# Patient Record
Sex: Female | Born: 1990 | Race: White | Hispanic: No | Marital: Married | Smoking: Former smoker
Health system: Southern US, Community
[De-identification: ages and names within clinical notes are randomized; demographics above are authoritative.]

## PROBLEM LIST (undated history)

## (undated) DIAGNOSIS — F32A Depression, unspecified: Secondary | ICD-10-CM

## (undated) DIAGNOSIS — F329 Major depressive disorder, single episode, unspecified: Secondary | ICD-10-CM

## (undated) DIAGNOSIS — F909 Attention-deficit hyperactivity disorder, unspecified type: Secondary | ICD-10-CM

## (undated) DIAGNOSIS — E282 Polycystic ovarian syndrome: Secondary | ICD-10-CM

## (undated) DIAGNOSIS — T7840XA Allergy, unspecified, initial encounter: Secondary | ICD-10-CM

## (undated) DIAGNOSIS — F419 Anxiety disorder, unspecified: Secondary | ICD-10-CM

## (undated) DIAGNOSIS — R011 Cardiac murmur, unspecified: Secondary | ICD-10-CM

## (undated) DIAGNOSIS — N390 Urinary tract infection, site not specified: Secondary | ICD-10-CM

## (undated) HISTORY — DX: Allergy, unspecified, initial encounter: T78.40XA

## (undated) HISTORY — DX: Anxiety disorder, unspecified: F41.9

## (undated) HISTORY — DX: Major depressive disorder, single episode, unspecified: F32.9

## (undated) HISTORY — DX: Urinary tract infection, site not specified: N39.0

## (undated) HISTORY — DX: Depression, unspecified: F32.A

## (undated) HISTORY — DX: Cardiac murmur, unspecified: R01.1

## (undated) HISTORY — DX: Polycystic ovarian syndrome: E28.2

## (undated) HISTORY — PX: NO PAST SURGERIES: SHX2092

---
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
## 2010-05-30 LAB — CBC
HCT: 38.2 % (ref 36.0–46.0)
Hemoglobin: 12.9 g/dL (ref 12.0–15.0)
MCHC: 34.8 g/dL (ref 30.0–36.0)
MCHC: 34.8 g/dL (ref 30.0–36.0)
MCV: 92.1 fL (ref 78.0–100.0)
MCV: 92.4 fL (ref 78.0–100.0)
Platelets: 150 10*3/uL (ref 150–400)
RBC: 3.55 MIL/uL — ABNORMAL LOW (ref 3.87–5.11)
RDW: 12.1 % (ref 11.5–15.5)
WBC: 10.3 10*3/uL (ref 4.0–10.5)

## 2010-05-30 LAB — COMPREHENSIVE METABOLIC PANEL
ALT: 11 U/L (ref 0–35)
Albumin: 3.4 g/dL — ABNORMAL LOW (ref 3.5–5.2)
Alkaline Phosphatase: 50 U/L (ref 39–117)
BUN: 11 mg/dL (ref 6–23)
CO2: 24 mEq/L (ref 19–32)
Calcium: 8.9 mg/dL (ref 8.4–10.5)
Chloride: 104 mEq/L (ref 96–112)
Creatinine, Ser: 1.34 mg/dL — ABNORMAL HIGH (ref 0.4–1.2)
GFR calc non Af Amer: 52 mL/min — ABNORMAL LOW (ref >60)
Potassium: 4 mEq/L (ref 3.5–5.1)
Sodium: 139 mEq/L (ref 135–145)
Total Bilirubin: 0.4 mg/dL (ref 0.3–1.2)
Total Protein: 5.9 g/dL — ABNORMAL LOW (ref 6.0–8.3)

## 2010-05-30 LAB — URINALYSIS, ROUTINE W REFLEX MICROSCOPIC
Bilirubin Urine: NEGATIVE
Nitrite: NEGATIVE
Specific Gravity, Urine: 1.01 (ref 1.005–1.030)
Urobilinogen, UA: 0.2 mg/dL (ref 0.0–1.0)

## 2010-05-30 LAB — URINE CULTURE

## 2010-05-30 LAB — DIFFERENTIAL
Basophils Absolute: 0.1 10*3/uL (ref 0.0–0.1)
Basophils Relative: 1 % (ref 0–1)
Eosinophils Absolute: 0.1 10*3/uL (ref 0.0–0.7)
Eosinophils Absolute: 0.2 10*3/uL (ref 0.0–0.7)
Eosinophils Relative: 1 % (ref 0–5)
Lymphocytes Relative: 27 % (ref 12–46)
Lymphs Abs: 2.1 10*3/uL (ref 0.7–4.0)
Monocytes Absolute: 0.7 10*3/uL (ref 0.1–1.0)
Monocytes Absolute: 0.9 10*3/uL (ref 0.1–1.0)
Monocytes Relative: 9 % (ref 3–12)
Neutro Abs: 6.3 10*3/uL (ref 1.7–7.7)
Neutrophils Relative %: 62 % (ref 43–77)

## 2010-05-30 LAB — POCT PREGNANCY, URINE

## 2010-05-30 LAB — GC/CHLAMYDIA PROBE AMP, GENITAL

## 2010-05-30 LAB — POCT I-STAT, CHEM 8
BUN: 12 mg/dL (ref 6–23)
Creatinine, Ser: 1.5 mg/dL — ABNORMAL HIGH (ref 0.4–1.2)
Glucose, Bld: 85 mg/dL (ref 70–99)
Sodium: 141 mEq/L (ref 135–145)
TCO2: 29 mmol/L (ref 0–100)

## 2010-05-30 LAB — WET PREP, GENITAL
Trich, Wet Prep: NONE SEEN
Yeast Wet Prep HPF POC: NONE SEEN

## 2010-07-15 NOTE — Procedures (Signed)
CLINICAL HISTORY:  A 20 year old girl with episode of tensing up and  generalized shaking. EEG is performed for evaluation of possible seizure.   DESCRIPTION:  The dominant rhythm of this tracing is a moderate amplitude  alpha rhythm of 9-10 Hz which predominates posteriorly, appears without  abnormal symmetry, and attenuates with eye-opening and closing. Low  amplitude fast activity is seen frontally and centrally and appears without  abnormal asymmetry. No focal slowing is noted and no epileptiform discharges  are seen. Drowsiness is not seen in this recording. Photic stimulation  produced symmetric driving responses, really only well seen at 11 and 13 Hz.  Hyperventilation produced mild slowing and buildup of the background rhythms  without appearance of focal abnormality. Single channel devoted to EKG  revealed sinus rhythm throughout with a rate of approximately 90 beats per  minutes.   CONCLUSIONS:  Normal study in the awake state.      EAV:WUJW  D:  06/16/2004 15:28:42  T:  06/16/2004 16:38:43  Job #:  [id number]

## 2011-04-24 ENCOUNTER — Ambulatory Visit (INDEPENDENT_AMBULATORY_CARE_PROVIDER_SITE_OTHER): Payer: BC Managed Care – PPO | Admitting: Family Medicine

## 2011-04-24 VITALS — BP 106/72 | HR 108 | Temp 98.4°F | Resp 20 | Ht 64.0 in | Wt 175.0 lb

## 2011-04-24 DIAGNOSIS — M940 Chondrocostal junction syndrome [Tietze]: Secondary | ICD-10-CM

## 2011-04-24 DIAGNOSIS — R112 Nausea with vomiting, unspecified: Secondary | ICD-10-CM

## 2011-04-24 MED ORDER — IBUPROFEN 800 MG PO TABS: 800.0000 mg | ORAL_TABLET | Freq: Three times a day (TID) | ORAL | Status: AC | PRN

## 2011-04-24 MED ORDER — PROMETHAZINE HCL 12.5 MG PO TABS: 12.5000 mg | ORAL_TABLET | Freq: Four times a day (QID) | ORAL | Status: AC | PRN

## 2011-04-24 NOTE — Progress Notes (Signed)
[facility]:  Office Visit  Chief Complaint:  Chief Complaint  Patient presents with  . Emesis    may have had alcohol poisoning yesterday (pt cannot recall last menses- stated maybe 2 months ago)  . Chest Pain    today  . Shortness of Breath    today    HPI: Chelsea Jimenez is a 21 y.o. female who complains of: 1. Nausea-with some vomiting, she had 4-5 drinks last night over a 5-6 hr period.  2. Chest pressure in mid chest- burning pressure, moves from sternum to both side, increase with palpation and movement. Associated with moderate SOB. She was unable to put a bra on because of pain .  Past Medical History  Diagnosis Date  . PCOS (polycystic ovarian syndrome) dx 2010   History reviewed. No pertinent past surgical history. History   Social History  . Marital Status: Single    Spouse Name: N/A    Number of Children: N/A  . Years of Education: N/A   Social History Main Topics  . Smoking status: Former Smoker -- 0.2 packs/day for 2 years    Types: Cigarettes    Quit date: 08/22/2010  . Smokeless tobacco: None  . Alcohol Use: Yes  . Drug Use: No  . Sexually Active: None   Other Topics Concern  . None   Social History Narrative  . None   Family History  Problem Relation Age of Onset  . Hypertension Mother   . Diabetes Mother   . Heart disease Maternal Grandmother   . Heart disease Maternal Grandfather   . Stroke Maternal Grandfather   . Cancer Paternal Grandmother    Allergies  Allergen Reactions  . Sulfa Antibiotics Rash and Other (See Comments)    child   Prior to Admission medications   Medication Sig Start Date End Date Taking? Authorizing Provider  sertraline (ZOLOFT) 50 MG tablet Take 100 mg by mouth daily.   Yes Historical Provider, MD  traZODone (DESYREL) 50 MG tablet Take 50 mg by mouth at bedtime.   Yes Historical Provider, MD     ROS: The patient denies fevers, chills, night sweats, unintentional weight loss,  palpitations, wheezing, dyspnea on exertion, nausea, vomiting, abdominal pain, dysuria, hematuria, melena, numbness, weakness, or tingling. + CP, SOB  All other systems have been reviewed and were otherwise negative with the exception of those mentioned in the HPI and as above.    PHYSICAL EXAM: Filed Vitals:   04/24/11 1327  BP: 106/72  Pulse: 108  Temp: 98.4 F (36.9 C)  Resp: 20   Filed Vitals:   04/24/11 1327  Height: 5\' 4"  (1.626 m)  Weight: 175 lb (79.379 kg)   Body mass index is 30.04 kg/(m^2).  General: Alert, no acute distress HEENT:  Normocephalic, atraumatic, oropharynx patent. + Dry oral mucosa Cardiovascular:  Regular rate and rhythm, no rubs murmurs or gallops.  No Carotid bruits, radial pulse intact. No pedal edema.  + tenderness on palpation Respiratory: Clear to auscultation bilaterally.  No wheezes, rales, or rhonchi.  No cyanosis, no use of accessory musculature GI: No organomegaly, abdomen is soft and non-tender, positive bowel sounds.  No masses. Skin: No rashes. Neurologic: Facial musculature symmetric. Psychiatric: Patient is appropriate throughout our interaction. Lymphatic: No cervical lymphadenopathy Musculoskeletal: Gait intact.   ASSESSMENT/PLAN: Encounter Diagnoses  Name Primary?  . Costochondritis Yes  . Nausea & vomiting    1. Chest pain secondary to costochondritis secondary to having vomited perfusely after  ingesting alcohol. Rx Ibuprofen with precautions 2. Patient is slightly nauseated, she is dehydrated but wants to go home and take meds  and push fluids. Rx limited # Phenargen  F/u prn if CP continues for xray     Geniva Lohnes PHUONG, DO 04/24/2011 2:32 PM

## 2011-04-25 ENCOUNTER — Ambulatory Visit: Payer: BC Managed Care – PPO

## 2013-09-08 ENCOUNTER — Ambulatory Visit: Payer: Managed Care, Other (non HMO)

## 2013-09-08 ENCOUNTER — Ambulatory Visit (INDEPENDENT_AMBULATORY_CARE_PROVIDER_SITE_OTHER): Payer: Managed Care, Other (non HMO) | Admitting: Family Medicine

## 2013-09-08 VITALS — BP 116/78 | HR 94 | Temp 98.3°F | Resp 16 | Ht 63.5 in | Wt 176.6 lb

## 2013-09-08 DIAGNOSIS — R143 Flatulence: Secondary | ICD-10-CM

## 2013-09-08 DIAGNOSIS — G4700 Insomnia, unspecified: Secondary | ICD-10-CM

## 2013-09-08 DIAGNOSIS — R142 Eructation: Secondary | ICD-10-CM

## 2013-09-08 DIAGNOSIS — R141 Gas pain: Secondary | ICD-10-CM

## 2013-09-08 DIAGNOSIS — R140 Abdominal distension (gaseous): Secondary | ICD-10-CM

## 2013-09-08 DIAGNOSIS — R110 Nausea: Secondary | ICD-10-CM

## 2013-09-08 LAB — POCT UA - MICROSCOPIC ONLY
BACTERIA, U MICROSCOPIC: NEGATIVE
CASTS, UR, LPF, POC: NEGATIVE
CRYSTALS, UR, HPF, POC: NEGATIVE
MUCUS UA: NEGATIVE
RBC, URINE, MICROSCOPIC: NEGATIVE
Yeast, UA: NEGATIVE

## 2013-09-08 LAB — POCT URINALYSIS DIPSTICK
Bilirubin, UA: NEGATIVE
Glucose, UA: NEGATIVE
KETONES UA: NEGATIVE
Nitrite, UA: NEGATIVE
PROTEIN UA: NEGATIVE
RBC UA: NEGATIVE
SPEC GRAV UA: 1.01
UROBILINOGEN UA: 0.2
pH, UA: 6.5

## 2013-09-08 LAB — POCT URINE PREGNANCY: PREG TEST UR: NEGATIVE

## 2013-09-08 MED ORDER — TRAZODONE HCL 50 MG PO TABS: 50.0000 mg | ORAL_TABLET | Freq: Every day | ORAL | Status: DC

## 2013-09-08 MED ORDER — DICYCLOMINE HCL 20 MG PO TABS: 20.0000 mg | ORAL_TABLET | Freq: Three times a day (TID) | ORAL | Status: DC

## 2013-09-08 MED ORDER — ONDANSETRON HCL 8 MG PO TABS: 8.0000 mg | ORAL_TABLET | Freq: Three times a day (TID) | ORAL | Status: DC | PRN

## 2013-09-08 NOTE — Progress Notes (Signed)
Pt states she has had sxs of entireabdomen pain that causes bloating and hard. Nausea has been associated with her sxs without any vomiting.  Pt also states she has had hemorrhoids associated with her sxs and they often occur 45 minutes after eating.

## 2013-09-08 NOTE — Progress Notes (Addendum)
[facility] [address], [place] [id number] [phone]  Date:  09/08/2013   Name:  Chelsea Jimenez   DOB:  08-07-1990   MRN:  [id number]  PCP:  Chelsea NgMCCLUNG,ANGELA, PA-C    Chief Complaint: Abdominal Pain, Nausea, Hemorrhoids and Medication Refill   History of Present Illness:  Chelsea Jimenez is a 23 y.o. very pleasant female patient who presents with the following:  Here today with illness. For about a month she has noted stomach bloating after eating.  Seems to have bloating every time she eats. This will make her clothes fit too tightly, and she will have a lot of pain and nausea.  Sometimes she may have some hard stools and has noted some hemorrhoids.   She is lactose intolerant. She did cut out dairy, but this did not seem to help.   No vomiting, she may sometimes have some diarrhea.   She has not had this in the past. She is generally a healthy person. LMP was 18 months ago- she is on continuous OCP per her OBG for PCOS.   No vaignal or urinary sx.  She has noted some blood on her stool some of the time but thinks it's just from hemorrhoids; she has noted some hard and painful stools.  She has noted some mucus at times.  No family history of UC orCcrohn's disease  She has tried to eat helthier but otherwise no diet changes.  She has not noted any change in her weight.  She did take 2 home HCG tests- yesterday and today.   Both negative  There are no active problems to display for this patient.   Past Medical History  Diagnosis Date  . PCOS (polycystic ovarian syndrome) dx 2010  . Allergy   . Depression   . Heart murmur     History reviewed. No pertinent past surgical history.  History  Substance Use Topics  . Smoking status: Former Smoker -- 0.25 packs/day for 2 years    Types: Cigarettes    Quit date: 08/22/2010  . Smokeless tobacco: Not on file  . Alcohol Use: Yes    Family History  Problem Relation Age of Onset  . Hypertension  Mother   . Diabetes Mother   . Heart disease Maternal Grandmother   . Heart disease Maternal Grandfather   . Stroke Maternal Grandfather   . Cancer Paternal Grandmother     Allergies  Allergen Reactions  . Shellfish Allergy Other (See Comments)    Skin rash  . Sulfa Antibiotics Rash and Other (See Comments)    child    Medication list has been reviewed and updated.  Current Outpatient Prescriptions on File Prior to Visit  Medication Sig Dispense Refill  . traZODone (DESYREL) 50 MG tablet Take 50 mg by mouth at bedtime.      . sertraline (ZOLOFT) 50 MG tablet Take 100 mg by mouth daily.       No current facility-administered medications on file prior to visit.    Review of Systems:  As per HPI- otherwise negative.   Physical Examination: Filed Vitals:   09/08/13 1235  BP: 116/78  Pulse: 94  Temp: 98.3 F (36.8 C)  Resp: 16   Filed Vitals:   09/08/13 1235  Height: 5' 3.5" (1.613 m)  Weight: 176 lb 9.6 oz (80.105 kg)   Body mass index is 30.79 kg/(m^2). Ideal Body Weight: Weight in (lb) to have BMI = 25: 143.1  GEN: WDWN,  NAD, Non-toxic, A & O x 3, overweight, looks well.  Accompanied today by her boyfriend HEENT: Atraumatic, Normocephalic. Neck supple. No masses, No LAD. Ears and Nose: No external deformity. CV: RRR, No M/G/R. No JVD. No thrill. No extra heart sounds. PULM: CTA B, no wheezes, crackles, rhonchi. No retractions. No resp. distress. No accessory muscle use. ABD: S, ND, +BS. No rebound. No HSM.  She has generalized mild tenderness throughout her abdomen EXTR: No c/c/e NEURO Normal gait.  PSYCH: Normally interactive. Conversant. Not depressed or anxious appearing.  Calm demeanor.   [facility] reading (PRIMARY) by  Dr. Patsy Lager. abd series: non- specific bowel gas pattern  ABDOMEN - 2 VIEW  COMPARISON: None.  FINDINGS: The bowel gas pattern is within the limits of normal. There is no ileus or obstruction. There are no abnormal soft  tissue calcifications. The bony structures are unremarkable.  IMPRESSION: There is no acute intra-abdominal abnormality.  Results for orders placed in visit on 09/08/13  POCT UA - MICROSCOPIC ONLY      Result Value Ref Range   WBC, Ur, HPF, POC 2-4     RBC, urine, microscopic neg     Bacteria, U Microscopic neg     Mucus, UA neg     Epithelial cells, urine per micros 0-6     Crystals, Ur, HPF, POC neg     Casts, Ur, LPF, POC neg     Yeast, UA neg    POCT URINALYSIS DIPSTICK      Result Value Ref Range   Color, UA yellow     Clarity, UA clear     Glucose, UA neg     Bilirubin, UA neg     Ketones, UA neg     Spec Grav, UA 1.010     Blood, UA neg     pH, UA 6.5     Protein, UA neg     Urobilinogen, UA 0.2     Nitrite, UA neg     Leukocytes, UA large (3+)    POCT URINE PREGNANCY      Result Value Ref Range   Preg Test, Ur Negative      Assessment and Plan: Abdominal bloating - Plan: DG Abd 2 Views, dicyclomine (BENTYL) 20 MG tablet, Ambulatory referral to Gastroenterology, POCT UA - Microscopic Only, POCT urinalysis dipstick, POCT urine pregnancy, Urine culture  Nausea alone - Plan: ondansetron (ZOFRAN) 8 MG tablet  Insomnia - Plan: traZODone (DESYREL) 50 MG tablet  Chelsea Jimenez is here today with abdominal sx of a month's duration.  I am therefore less suspicious of dangerous etiology such as appendicitis.  Suspect IBS.  Will have her try bentyl as needed for her abdominal cramping and bloating.  zofran prn for nausea.  Will refer to GI as she could also have IBD and may need a colonoscopy Refilled her trazodone.  Await urine culture She declined blood work today- notes that she "almost always" passes out with blood draws.    See patient instructions for more details.     Signed Abbe Amsterdam, MD  7/15: Received urine culture- negative for infection. Her GI sx are somewhat better with the bentyl.

## 2013-09-08 NOTE — Patient Instructions (Signed)
Continue your trazadone and zoloft.  We are going to refer you to see a GI doctor to further discuss your symptoms. In the meantime try the bentyl before meals and before bed to help with spasms and bloating.  You can use zofran as needed for nausea.  If you are getting worse, having more pain or have any other concerns in the meantime please call or come back in.    For hemorrhoids, try taking docusate sodium (colace) as needed to keep your stool soft and easy to pass.

## 2013-09-10 ENCOUNTER — Encounter: Payer: Self-pay | Admitting: Gastroenterology

## 2013-09-10 ENCOUNTER — Encounter: Payer: Self-pay | Admitting: Family Medicine

## 2013-09-10 LAB — URINE CULTURE
COLONY COUNT: NO GROWTH
Organism ID, Bacteria: NO GROWTH

## 2013-09-11 ENCOUNTER — Telehealth: Payer: Self-pay

## 2013-09-11 NOTE — Telephone Encounter (Signed)
Spoke to pt- advised her to give us a call if she needs refills on her medications. She currently has enough. She is feeling about the same as she did on Monday. It is tolerable. Advised pt to RTC if her symptoms do not improve or get worse. She has been advised that Dr. Patsy Lageropland will be out of the office until Monday but can see anyone of the providers if she comes in.

## 2013-09-11 NOTE — Telephone Encounter (Signed)
Patient is requesting advise on what will happen when her medication runs out  that dr. Patsy Lagercopland gave her till her appointment with gastro at [facility] 10/29/13. Please advise. Patient wanted to speak to Dr. Patsy Lageropland about this.   Best: [phone]

## 2013-09-16 ENCOUNTER — Telehealth: Payer: Self-pay

## 2013-09-16 NOTE — Telephone Encounter (Signed)
Called her back. She states that her sx are stable- bentyl does help her to eat, but bloating and pain will return shortly after eating.  States that today her pain was enough to make her pull over while driving. In this case advised her to be seen today either at our office or at the ER, as she likely needs labs and/ or CT scan.   She has an appt with [facility] on 9/21; I will try to move this up for her.  I am not able to prescribe another medication for her sx, as we need to try and find out the cause of her sx first.  She states understanding

## 2013-09-16 NOTE — Telephone Encounter (Signed)
Patient was referred to a specialist for her stomach pain and has an appointment on September 21. Per patient her pain has not stopped and she is requesting medication to be called in until her appointment in September. Patients pharmacy is [facility] [address] Patients call back number is [phone]

## 2013-09-17 ENCOUNTER — Ambulatory Visit (INDEPENDENT_AMBULATORY_CARE_PROVIDER_SITE_OTHER): Payer: Managed Care, Other (non HMO) | Admitting: Family Medicine

## 2013-09-17 VITALS — BP 120/68 | HR 91 | Temp 98.5°F | Resp 18 | Ht 63.5 in | Wt 176.0 lb

## 2013-09-17 DIAGNOSIS — R109 Unspecified abdominal pain: Secondary | ICD-10-CM

## 2013-09-17 LAB — POCT CBC
Granulocyte percent: 65.2 %G (ref 37–80)
HEMATOCRIT: 42.9 % (ref 37.7–47.9)
HEMOGLOBIN: 14.1 g/dL (ref 12.2–16.2)
LYMPH, POC: 2.8 (ref 0.6–3.4)
MCH: 29.8 pg (ref 27–31.2)
MCHC: 32.8 g/dL (ref 31.8–35.4)
MCV: 90.6 fL (ref 80–97)
MID (cbc): 0.4 (ref 0–0.9)
MPV: 10.8 fL (ref 0–99.8)
POC Granulocyte: 5.9 (ref 2–6.9)
POC LYMPH %: 30.5 % (ref 10–50)
POC MID %: 4.3 % (ref 0–12)
Platelet Count, POC: 236 10*3/uL (ref 142–424)
RBC: 4.73 M/uL (ref 4.04–5.48)
RDW, POC: 12.3 %
WBC: 9.1 10*3/uL (ref 4.6–10.2)

## 2013-09-17 MED ORDER — SUCRALFATE 1 G PO TABS: 1.0000 g | ORAL_TABLET | Freq: Three times a day (TID) | ORAL | Status: DC

## 2013-09-17 NOTE — Patient Instructions (Signed)
We will get your ultrasound arranged for Friday am and will give you a call with details and the rest of your labs.   Once we get your ultrasound results we may be able to get your GI appointment moved up.

## 2013-09-17 NOTE — Progress Notes (Addendum)
[facility] [address] [place] [address] [phone]  Date:  09/17/2013   Name:  Chelsea Jimenez   DOB:  [birth year]   MRN:  [id number]  PCP:  Anne Ng    Chief Complaint: Follow-up   History of Present Illness:  Chelsea Jimenez is a 23 y.o. very pleasant female patient who presents with the following:  Here today to recheck abdominal pain.  She was seen a week ago with pain of about a month's duration, worse after eating and characterized by bloating and pain throughout the abdomen.  We tried some bentyl, which did help some but her sx are still there.  They are not worse but are persistent.    Her mother had her gallbladder out.   No further blood in her stool.   She is otherwise generally healthy except for overweight/ PCOS  There are no active problems to display for this patient.   Past Medical History  Diagnosis Date  . PCOS (polycystic ovarian syndrome) dx 2010  . Allergy   . Depression   . Heart murmur     History reviewed. No pertinent past surgical history.  History  Substance Use Topics  . Smoking status: Former Smoker -- 0.25 packs/day for 2 years    Types: Cigarettes    Quit date: 08/22/2010  . Smokeless tobacco: Not on file  . Alcohol Use: Yes    Family History  Problem Relation Age of Onset  . Hypertension Mother   . Diabetes Mother   . Heart disease Maternal Grandmother   . Heart disease Maternal Grandfather   . Stroke Maternal Grandfather   . Cancer Paternal Grandmother     Allergies  Allergen Reactions  . Shellfish Allergy Other (See Comments)    Skin rash  . Sulfa Antibiotics Rash and Other (See Comments)    child    Medication list has been reviewed and updated.  Current Outpatient Prescriptions on File Prior to Visit  Medication Sig Dispense Refill  . dicyclomine (BENTYL) 20 MG tablet Take 1 tablet (20 mg total) by mouth 4 (four) times daily -  before meals and at bedtime.  40 tablet   2  . ondansetron (ZOFRAN) 8 MG tablet Take 1 tablet (8 mg total) by mouth every 8 (eight) hours as needed for nausea or vomiting.  15 tablet  0  . traZODone (DESYREL) 50 MG tablet Take 1 tablet (50 mg total) by mouth at bedtime.  30 tablet  6   No current facility-administered medications on file prior to visit.    Review of Systems:  As per HPI- otherwise negative.   Physical Examination: Filed Vitals:   09/17/13 1322  BP: 120/68  Pulse: 91  Temp: 98.5 F (36.9 C)  Resp: 18   Filed Vitals:   09/17/13 1322  Height: 5' 3.5" (1.613 m)  Weight: 176 lb (79.833 kg)   Body mass index is 30.68 kg/(m^2). Ideal Body Weight: Weight in (lb) to have BMI = 25: 143.1  GEN: WDWN, NAD, Non-toxic, A & O x 3, overweight, looks well HEENT: Atraumatic, Normocephalic. Neck supple. No masses, No LAD. Ears and Nose: No external deformity. CV: RRR, No M/G/R. No JVD. No thrill. No extra heart sounds. PULM: CTA B, no wheezes, crackles, rhonchi. No retractions. No resp. distress. No accessory muscle use. ABD: S,  ND, +BS. No rebound. No HSM.  She has mild tenderness over the entire abdomen.  Currently states pain is worst at  the epigastrum EXTR: No c/c/e NEURO Normal gait.  PSYCH: Normally interactive. Conversant. Not depressed or anxious appearing.  Calm demeanor.    Results for orders placed in visit on 09/17/13  POCT CBC      Result Value Ref Range   WBC 9.1  4.6 - 10.2 K/uL   Lymph, poc 2.8  0.6 - 3.4   POC LYMPH PERCENT 30.5  10 - 50 %L   MID (cbc) 0.4  0 - 0.9   POC MID % 4.3  0 - 12 %M   POC Granulocyte 5.9  2 - 6.9   Granulocyte percent 65.2  37 - 80 %G   RBC 4.73  4.04 - 5.48 M/uL   Hemoglobin 14.1  12.2 - 16.2 g/dL   HCT, POC 52.842.9  41.337.7 - 47.9 %   MCV 90.6  80 - 97 fL   MCH, POC 29.8  27 - 31.2 pg   MCHC 32.8  31.8 - 35.4 g/dL   RDW, POC 24.412.3     Platelet Count, POC 236  142 - 424 K/uL   MPV 10.8  0 - 99.8 fL    Assessment and Plan: Abdominal pain, unspecified site -  Plan: POCT CBC, Comprehensive metabolic panel, US Abdomen Limited RUQ, sucralfate (CARAFATE) 1 G tablet  Persistent abdominal pain.  Discussed a CT scan.  However her pain is subacute, of more than a month's duration.  Emergent pathology such as appendicitis is very unlikely.  Will plan a RUQ ultrasound to evaluate her gallbladder and we were able to get blood today for labs.   She will use carafate as needed ,and I will follow-up with her pending additional labs and ultrasound.  With this information will decide if earlier GI appt is needed   She will seek care right away if worse   Signed Abbe AmsterdamJessica Copland, MD  Labs are normal, await her ultrasound tomorrow Results for orders placed in visit on 09/17/13  COMPREHENSIVE METABOLIC PANEL      Result Value Ref Range   Sodium 135  135 - 145 mEq/L   Potassium 4.2  3.5 - 5.3 mEq/L   Chloride 104  96 - 112 mEq/L   CO2 26  19 - 32 mEq/L   Glucose, Bld 116 (*) 70 - 99 mg/dL   BUN 6  6 - 23 mg/dL   Creat 0.100.67  2.720.50 - 5.361.10 mg/dL   Total Bilirubin 0.3  0.2 - 1.2 mg/dL   Alkaline Phosphatase 49  39 - 117 U/L   AST 19  0 - 37 U/L   ALT 25  0 - 35 U/L   Total Protein 6.7  6.0 - 8.3 g/dL   Albumin 3.9  3.5 - 5.2 g/dL   Calcium 9.3  8.4 - 64.410.5 mg/dL  POCT CBC      Result Value Ref Range   WBC 9.1  4.6 - 10.2 K/uL   Lymph, poc 2.8  0.6 - 3.4   POC LYMPH PERCENT 30.5  10 - 50 %L   MID (cbc) 0.4  0 - 0.9   POC MID % 4.3  0 - 12 %M   POC Granulocyte 5.9  2 - 6.9   Granulocyte percent 65.2  37 - 80 %G   RBC 4.73  4.04 - 5.48 M/uL   Hemoglobin 14.1  12.2 - 16.2 g/dL   HCT, POC 03.442.9  74.237.7 - 47.9 %   MCV 90.6  80 - 97 fL   MCH, POC 29.8  27 - 31.2 pg   MCHC 32.8  31.8 - 35.4 g/dL   RDW, POC 40.9     Platelet Count, POC 236  142 - 424 K/uL   MPV 10.8  0 - 99.8 fL

## 2013-09-18 ENCOUNTER — Encounter: Payer: Self-pay | Admitting: Family Medicine

## 2013-09-18 LAB — COMPREHENSIVE METABOLIC PANEL
ALK PHOS: 49 U/L (ref 39–117)
ALT: 25 U/L (ref 0–35)
AST: 19 U/L (ref 0–37)
Albumin: 3.9 g/dL (ref 3.5–5.2)
BILIRUBIN TOTAL: 0.3 mg/dL (ref 0.2–1.2)
BUN: 6 mg/dL (ref 6–23)
CO2: 26 meq/L (ref 19–32)
CREATININE: 0.67 mg/dL (ref 0.50–1.10)
Calcium: 9.3 mg/dL (ref 8.4–10.5)
Chloride: 104 mEq/L (ref 96–112)
GLUCOSE: 116 mg/dL — AB (ref 70–99)
Potassium: 4.2 mEq/L (ref 3.5–5.3)
Sodium: 135 mEq/L (ref 135–145)
Total Protein: 6.7 g/dL (ref 6.0–8.3)

## 2013-09-19 ENCOUNTER — Telehealth: Payer: Self-pay | Admitting: *Deleted

## 2013-09-19 ENCOUNTER — Ambulatory Visit
Admission: RE | Admit: 2013-09-19 | Discharge: 2013-09-19 | Disposition: A | Discharge status: Home / Self Care | Payer: Managed Care, Other (non HMO) | Source: Ambulatory Visit | Attending: Family Medicine | Admitting: Family Medicine

## 2013-09-19 DIAGNOSIS — R109 Unspecified abdominal pain: Secondary | ICD-10-CM

## 2013-09-19 NOTE — Telephone Encounter (Signed)
Called [facility] to schedule a sooner appointment per Dr. Patsy Lageropland. She was given and appointment for 10/01/2013 @ 9am. Patient was notified

## 2013-09-24 ENCOUNTER — Encounter: Payer: Self-pay | Admitting: Family Medicine

## 2013-10-01 ENCOUNTER — Encounter: Payer: Self-pay | Admitting: Gastroenterology

## 2013-10-01 ENCOUNTER — Other Ambulatory Visit (INDEPENDENT_AMBULATORY_CARE_PROVIDER_SITE_OTHER): Payer: Managed Care, Other (non HMO)

## 2013-10-01 ENCOUNTER — Ambulatory Visit (INDEPENDENT_AMBULATORY_CARE_PROVIDER_SITE_OTHER): Payer: Managed Care, Other (non HMO) | Admitting: Gastroenterology

## 2013-10-01 VITALS — BP 104/80 | HR 86 | Ht 63.5 in | Wt 177.5 lb

## 2013-10-01 DIAGNOSIS — R142 Eructation: Secondary | ICD-10-CM

## 2013-10-01 DIAGNOSIS — R110 Nausea: Secondary | ICD-10-CM

## 2013-10-01 DIAGNOSIS — R140 Abdominal distension (gaseous): Secondary | ICD-10-CM

## 2013-10-01 DIAGNOSIS — R109 Unspecified abdominal pain: Secondary | ICD-10-CM

## 2013-10-01 DIAGNOSIS — R143 Flatulence: Secondary | ICD-10-CM

## 2013-10-01 DIAGNOSIS — K602 Anal fissure, unspecified: Secondary | ICD-10-CM

## 2013-10-01 DIAGNOSIS — R141 Gas pain: Secondary | ICD-10-CM

## 2013-10-01 LAB — TSH: TSH: 0.83 u[IU]/mL (ref 0.35–4.50)

## 2013-10-01 LAB — C-REACTIVE PROTEIN: CRP: 1.3 mg/dL (ref 0.5–20.0)

## 2013-10-01 LAB — SEDIMENTATION RATE: Sed Rate: 21 mm/hr (ref 0–22)

## 2013-10-01 LAB — IGA: IGA: 152 mg/dL (ref 68–378)

## 2013-10-01 MED ORDER — LIDOCAINE (ANORECTAL) 5 % EX CREA: TOPICAL_CREAM | CUTANEOUS | Status: DC

## 2013-10-01 MED ORDER — DILTIAZEM GEL 2 %: CUTANEOUS | Status: DC

## 2013-10-01 MED ORDER — ONDANSETRON HCL 8 MG PO TABS: 8.0000 mg | ORAL_TABLET | Freq: Three times a day (TID) | ORAL | Status: DC | PRN

## 2013-10-01 NOTE — Patient Instructions (Addendum)
Your physician has requested that you go to the basement for the following lab work before leaving today: TSH, Sed Rate, CRP, IgA, TtG  We have sent the following medications to your pharmacy for you to pick up at your convenience: Zofran  We have sent the following medications to [facility] for you to pick up at your convenience: Diltiazem gel  Please purchase the following medications over the counter and take as directed: Probiotic (florastor, Electronics engineer)  We have given you samples of the following medication to take: Recticare- Apply inside rectum up to first knuckle until you pick up diltiazem  You have been scheduled for a CT scan of the abdomen and pelvis at [facility] ([address]---this is in the same building as Press [facility]).   You are scheduled on 10/02/13 at 9:30 am. You should arrive 15 minutes prior to your appointment time for registration. Please follow the written instructions below on the day of your exam:  WARNING: IF YOU ARE ALLERGIC TO IODINE/X-RAY DYE, PLEASE NOTIFY RADIOLOGY IMMEDIATELY AT [phone]! YOU WILL BE GIVEN A 13 HOUR PREMEDICATION PREP.  1) Do not eat or drink anything after 5:30 am (4 hours prior to your test) 2) You have been given 2 bottles of oral contrast to drink. The solution may taste better if refrigerated, but do NOT add ice or any other liquid to this solution. Shake well before drinking.    Drink 1 bottle of contrast @ 7:30 am (2 hours prior to your exam)  Drink 1 bottle of contrast @ 8:30 am (1 hour prior to your exam)  You may take any medications as prescribed with a small amount of water except for the following: Metformin, Glucophage, Glucovance, Avandamet, Riomet, Fortamet, Actoplus Met, Janumet, Glumetza or Metaglip. The above medications must be held the day of the exam AND 48 hours after the exam.  The purpose of you drinking the oral contrast is to aid in the visualization of your intestinal tract. The contrast  solution may cause some diarrhea. Before your exam is started, you will be given a small amount of fluid to drink. Depending on your individual set of symptoms, you may also receive an intravenous injection of x-ray contrast/dye. Plan on being at [facility] for 30 minutes or long, depending on the type of exam you are having performed.  This test typically takes 30-45 minutes to complete.  If you have any questions regarding your exam or if you need to reschedule, you may call the CT department at [phone] between the hours of 8:00 am and 5:00 pm, Monday-Friday.  ________________________________________________________________________  CC:Dr Nancy Fetter

## 2013-10-01 NOTE — Progress Notes (Signed)
10/01/2013 Chelsea Jimenez [id number] 10/16/1990   HISTORY OF PRESENT ILLNESS:  This is a pleasant 23 year old female who is new to our office and was referred by Dr. Dallas Schimkeopeland for evaluation regarding recent issues with abdominal bloating, pain, and nausea. She is here today with her mom. She has PCOS and some depression/anxiety.  She tells me that she is not under any new or added stress recently.  She tells me that she's suspected that she's had lactose intolerance and symptoms related to that for approximately the last 18 months. All of the more significant issues [facility]. She says that every day her entire abdomen becomes extremely bloated after eating. Most of the time and she wakes up in the morning it is fine but as soon as she eats anything it becomes very distended. She's not gaining weight but is not able to fit in her normal pant size so is having to wear bigger clothes despite no weight gain.  This causes her abdomen to be very   Says that she looks like she is pregnant. She complains of nausea after eating as well but denies any vomiting, although she does become very close at times. She had stopped eating dairy altogether, but that allowed no improvement in her symptoms. She denies any diarrhea. She was seen at the urgent care and a limited right upper quadrant ultrasound performed it was unremarkable. CBC, CMP, urine studies, and pregnancy tests were all negative. They place her on dicyclomine 20 mg, which she's been taking 4 times daily. She was also given Zofran 8 mg which she is taking as needed for nausea, and was given Carafate to take four times daily. She has not noticed any improvement with the Carafate, but the Zofran does help with the nausea. She may notice a slight improvement with the dicyclomine but it has caused her to become somewhat constipated since beginning the medication.  Denies any heartburn or reflux.  Follows with GYN regularly for her  PCOS.   She also reports that she's had problems with hemorrhoids in the past. She does have a couple external hemorrhoids but recently it has been very painful for her to have a bowel movement. She does see some bright red blood when wiping also.    Past Medical History  Diagnosis Date  . PCOS (polycystic ovarian syndrome) dx 2010  . Allergy   . Depression   . Heart murmur   . Anxiety   . UTI (lower urinary tract infection)    History reviewed. No pertinent past surgical history.  reports that she quit smoking about 3 years ago. Her smoking use included Cigarettes. She has a .5 pack-year smoking history. She has never used smokeless tobacco. She reports that she drinks alcohol. She reports that she does not use illicit drugs. family history includes Breast cancer in her maternal grandmother; Diabetes in her maternal grandfather, maternal grandmother, mother, and paternal grandfather; Heart disease in her maternal grandfather and maternal grandmother; Hypertension in her mother; Moyamoya disease in her maternal grandmother; Ovarian cancer in her paternal grandmother; Pancreatic cancer in her paternal grandfather; Stroke in her maternal grandmother. Allergies  Allergen Reactions  . Shellfish Allergy Other (See Comments)    Skin rash  . Sulfa Antibiotics Rash and Other (See Comments)    child      Outpatient Encounter Prescriptions as of 10/01/2013  Medication Sig  . dicyclomine (BENTYL) 20 MG tablet Take 1 tablet (20 mg total) by mouth  4 (four) times daily -  before meals and at bedtime.  . norethindrone-ethinyl estradiol-iron (MICROGESTIN FE,GILDESS FE,LOESTRIN FE) 1.5-30 MG-MCG tablet Take 1 tablet by mouth daily.  . ondansetron (ZOFRAN) 8 MG tablet Take 1 tablet (8 mg total) by mouth every 8 (eight) hours as needed for nausea or vomiting.  . sucralfate (CARAFATE) 1 G tablet Take 1 tablet (1 g total) by mouth 4 (four) times daily -  with meals and at bedtime.  . traZODone (DESYREL)  50 MG tablet Take 1 tablet (50 mg total) by mouth at bedtime.  . [DISCONTINUED] ondansetron (ZOFRAN) 8 MG tablet Take 1 tablet (8 mg total) by mouth every 8 (eight) hours as needed for nausea or vomiting.  . diltiazem 2 % GEL Apply inside rectum up to first knuckle 2-3 times daily  . Lidocaine, Anorectal, (RECTICARE) 5 % CREA Apply rectally 2-3 times daily until you get diltizem gel     REVIEW OF SYSTEMS  : All other systems reviewed and negative except where noted in the History of Present Illness.   PHYSICAL EXAM: BP 104/80  Pulse 86  Ht 5' 3.5" (1.613 m)  Wt 177 lb 8 oz (80.513 kg)  BMI 30.95 kg/m2 General: Well developed white female in no acute distress Head: Normocephalic and atraumatic Eyes:  Sclerae anicteric, conjunctiva pink. Ears: Normal auditory acuity Lungs: Clear throughout to auscultation Heart: Regular rate and rhythm Abdomen: Soft, non-distended.  Normal bowel sounds.  Diffusely TTP > in the lower abdomen. Rectal:  A couple of small external hemorrhoids were noted.  Anal fissure noted as well.  DRE not able to be completed due to pain. Musculoskeletal: Symmetrical with no gross deformities  Skin: No lesions on visible extremities Extremities: No edema  Neurological: Alert oriented x 4, grossly non-focal Psychological:  Alert and cooperative. Normal mood and affect  ASSESSMENT AND PLAN: -Abdominal bloating, abdominal pain, and nausea:  Unsure of the cause of her symptoms.  Will check labs including TSH, Sed rate/CRP, and celiac labs.  Will check CT scan of the abdomen and pelvis with contrast.  Discontinue carafate since it does not seem to be helping.  Continue bentyl and zofran for now.  Will refill zofran.  Can get a probiotic such as Florastor or Align to try in the interim. -Anal fissure:  Will prescribe diltiazem gel to apply tow to three times daily (needs to get it internally to first knuckle); prescription sent to [facility].  Samples of Recticare  lidocaine cream use first to numb the area prior to applying the diltiazem.

## 2013-10-01 NOTE — Progress Notes (Signed)
Reviewed and agree with management plan.  Johnmatthew Solorio T. Lilinoe Acklin, MD FACG 

## 2013-10-02 ENCOUNTER — Telehealth: Payer: Self-pay | Admitting: *Deleted

## 2013-10-02 ENCOUNTER — Ambulatory Visit (INDEPENDENT_AMBULATORY_CARE_PROVIDER_SITE_OTHER)
Admission: RE | Admit: 2013-10-02 | Discharge: 2013-10-02 | Disposition: A | Discharge status: Home / Self Care | Payer: Managed Care, Other (non HMO) | Source: Ambulatory Visit | Attending: Gastroenterology | Admitting: Gastroenterology

## 2013-10-02 DIAGNOSIS — R141 Gas pain: Secondary | ICD-10-CM

## 2013-10-02 DIAGNOSIS — R142 Eructation: Secondary | ICD-10-CM

## 2013-10-02 DIAGNOSIS — R109 Unspecified abdominal pain: Secondary | ICD-10-CM

## 2013-10-02 DIAGNOSIS — R140 Abdominal distension (gaseous): Secondary | ICD-10-CM

## 2013-10-02 DIAGNOSIS — R143 Flatulence: Secondary | ICD-10-CM

## 2013-10-02 LAB — TISSUE TRANSGLUTAMINASE, IGA: Tissue Transglutaminase Ab, IgA: 3.9 U/mL (ref <20)

## 2013-10-02 MED ORDER — IOHEXOL 300 MG/ML  SOLN
100.0000 mL | Freq: Once | INTRAMUSCULAR | Status: AC | PRN
  Administered 2013-10-02: 100 mL via INTRAVENOUS

## 2013-10-02 NOTE — Telephone Encounter (Signed)
RECEIVED VIA FAX FROM [facility] NEEDED FOR ZOFRAN CALLED [phone] WAS TOLD TO CALL [phone] I SPOKE WITH DAWN  [facility] AND PATIENT WAS NOTIFIED  CASE ID: [id number]

## 2013-11-17 ENCOUNTER — Ambulatory Visit: Payer: Self-pay | Admitting: Gastroenterology

## 2014-10-20 IMAGING — CT CT ABD-PELV W/ CM
2 of 4 series · 17 of 46 positions shown, 19 images · IV contrast (Omnipaque 300)
Comparison: CT of the abdomen and pelvis 02/22/2009.

CLINICAL DATA: Abdominal pain and bloating for the past 3 months.

EXAM:
CT ABDOMEN AND PELVIS WITH CONTRAST
TECHNIQUE: Multidetector CT imaging of the abdomen and pelvis was performed
using the standard protocol following bolus administration of
intravenous contrast.
CONTRAST:  100mL OMNIPAQUE IOHEXOL 300 MG/ML  SOLN

[Series 2: abd/ pel 5mm · axial · 0.72mm/px · z∈[-500,-64]mm · 14 of 95 slices shown, 16 images]
[im 4/95  soft-tissue]
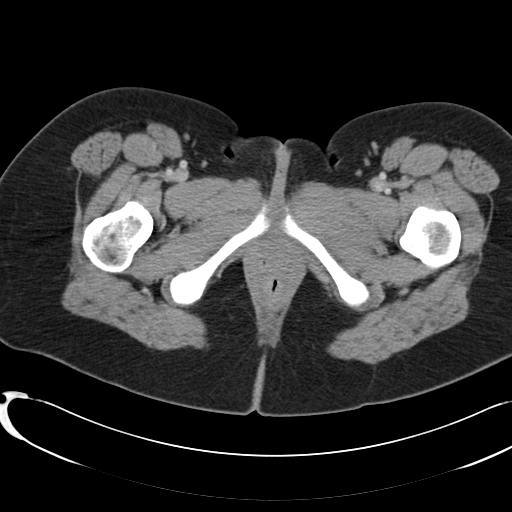
[im 4/95  bone]
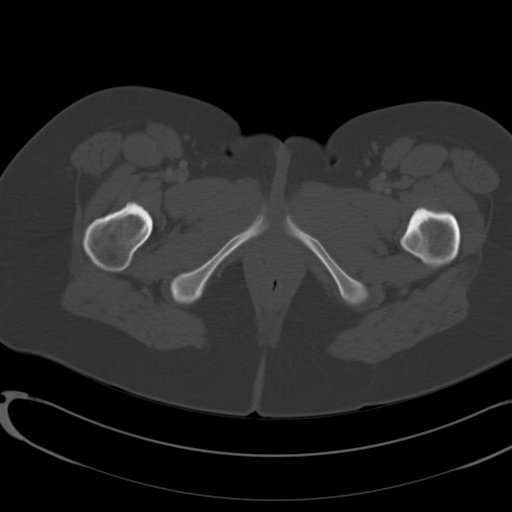
[im 12/95  soft-tissue]
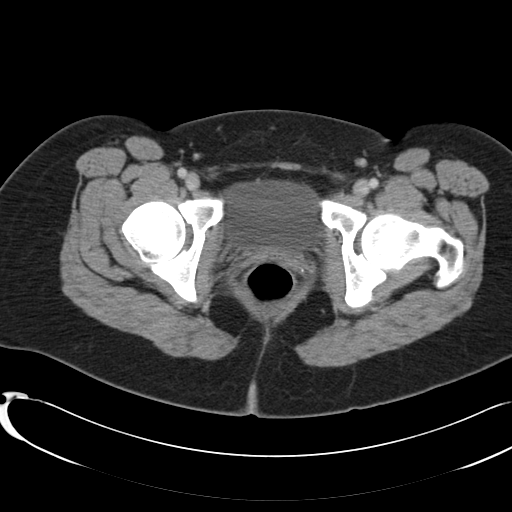
[im 20/95  soft-tissue]
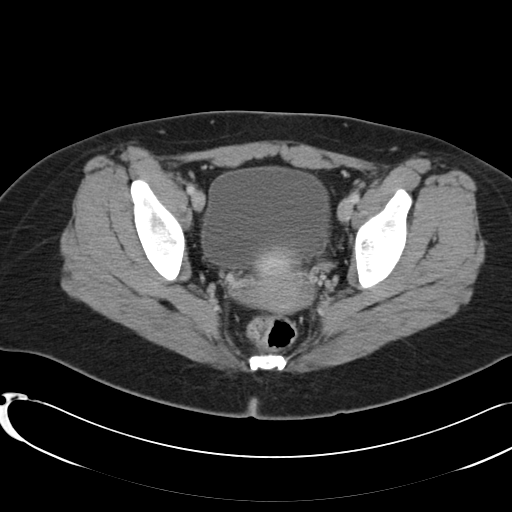
[im 24/95  soft-tissue]
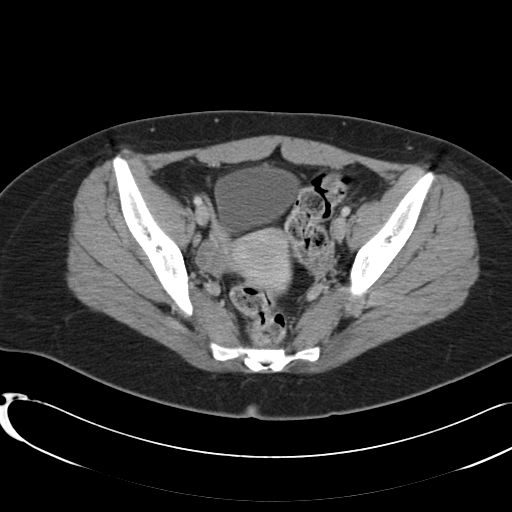
[im 32/95  soft-tissue]
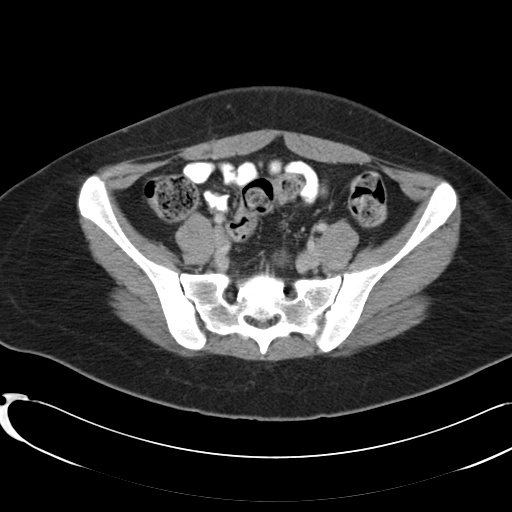
[im 40/95  soft-tissue]
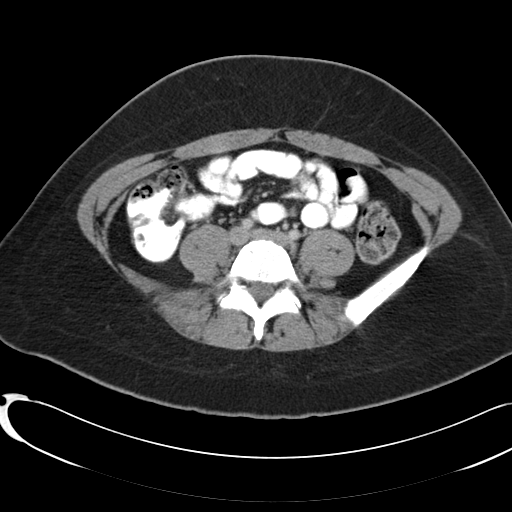
[im 44/95  soft-tissue]
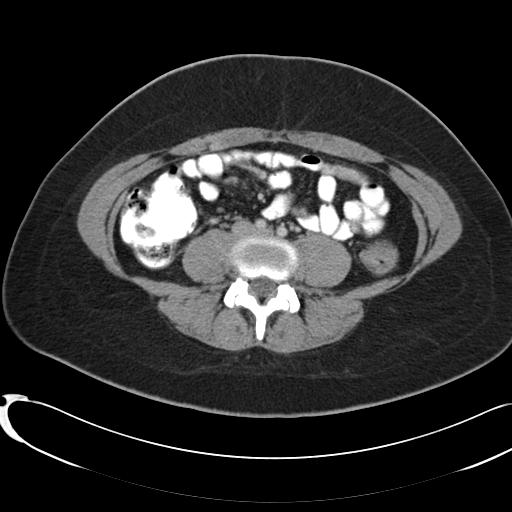
[im 51/95  soft-tissue]
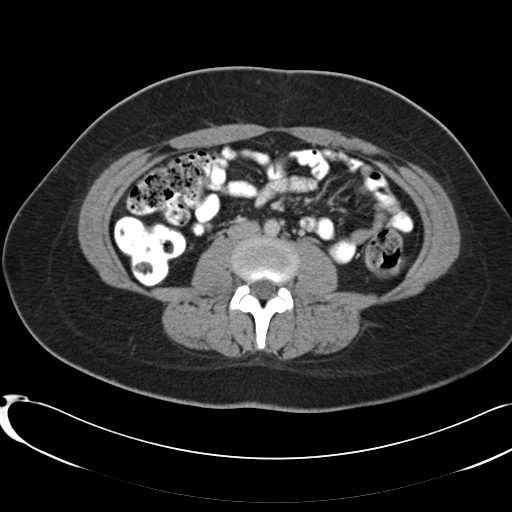
[im 55/95  soft-tissue]
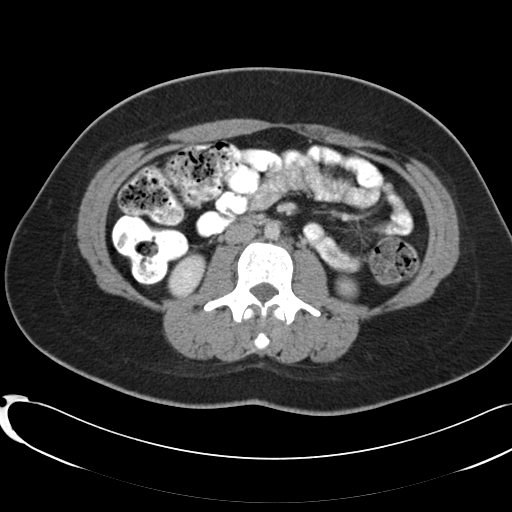
[im 55/95  bone]
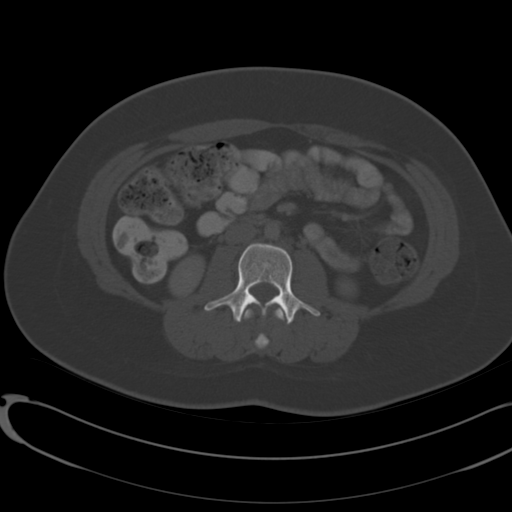
[im 63/95  soft-tissue]
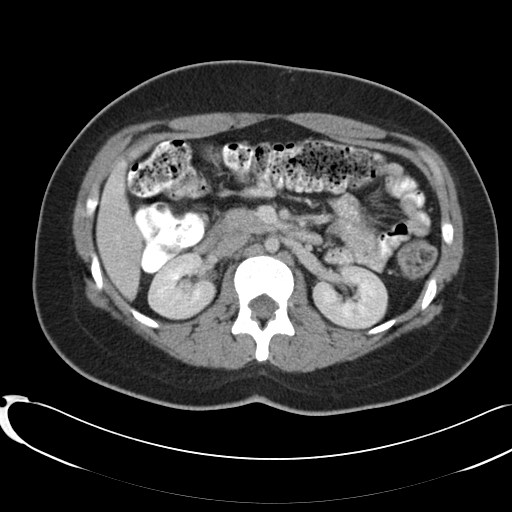
[im 71/95  soft-tissue]
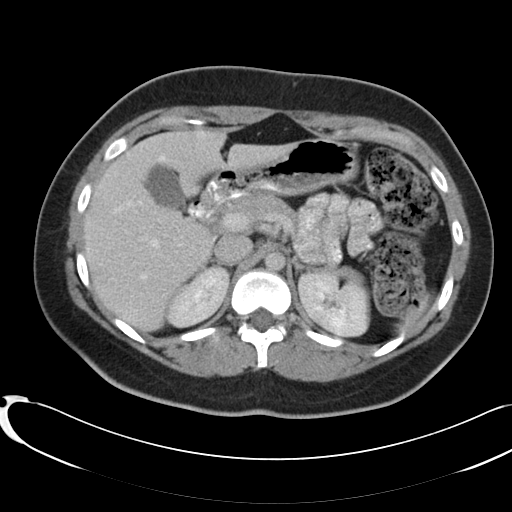
[im 75/95  soft-tissue]
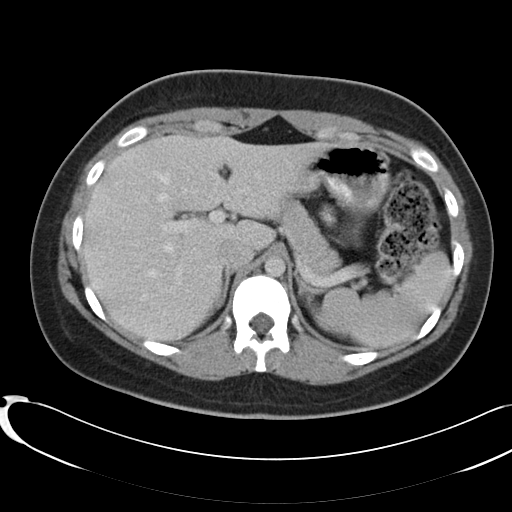
[im 83/95  soft-tissue]
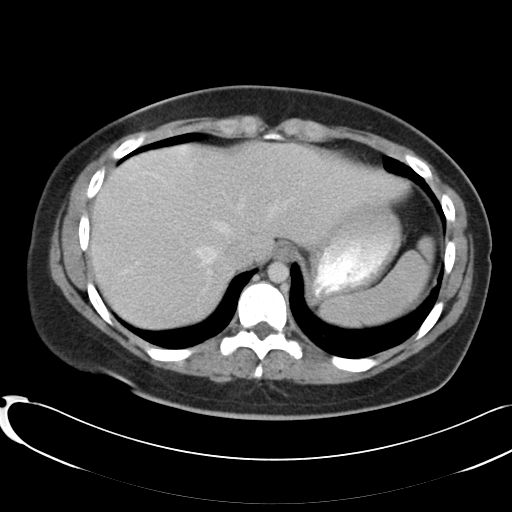
[im 91/95  soft-tissue]
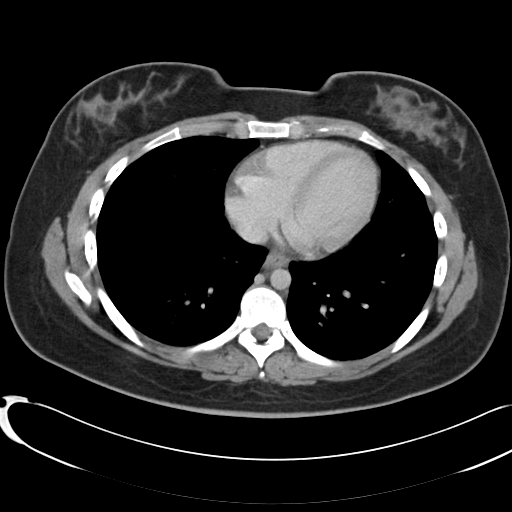

[Series 602: coronals · coronal · 0.95mm/px · 3 of 109 slices shown]
[im 37/109  soft-tissue]
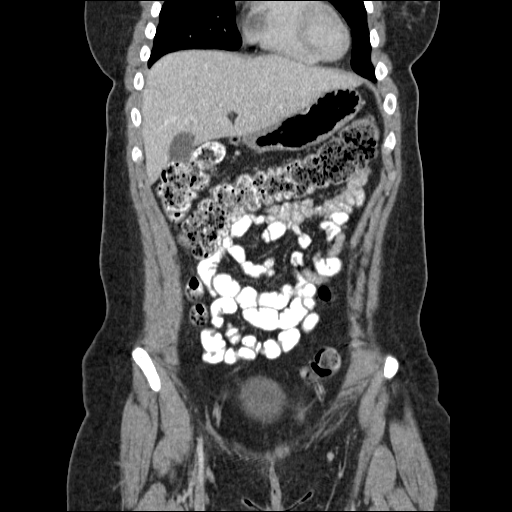
[im 49/109  soft-tissue]
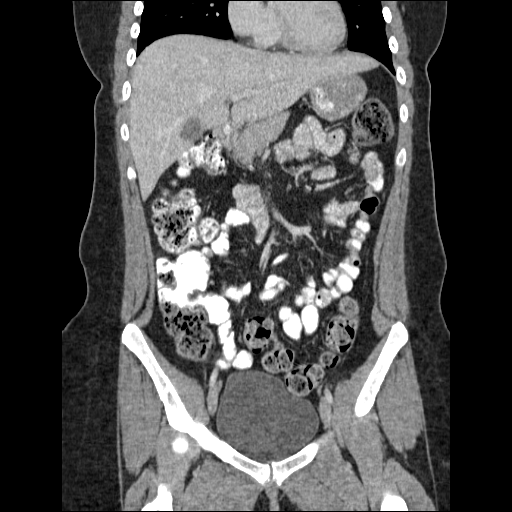
[im 61/109  soft-tissue]
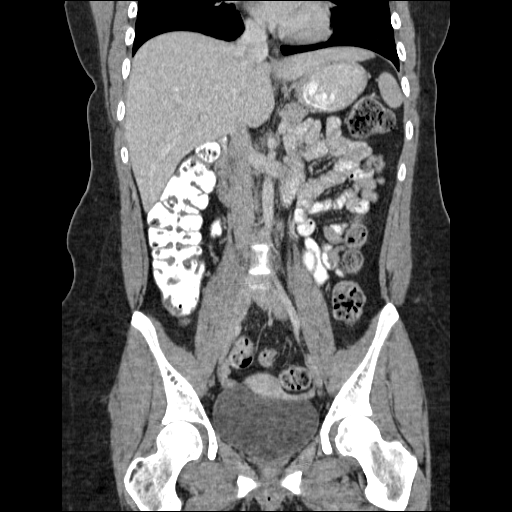

[17 of 46 positions shown; findings below may reference images not displayed]

FINDINGS: Lung Bases: Unremarkable.

Abdomen/Pelvis: The appearance of the liver, gallbladder, pancreas,
spleen, bilateral adrenal glands and bilateral kidneys is
unremarkable. No significant volume of ascites. No pneumoperitoneum.
No pathologic distention of small bowel. No lymphadenopathy
identified in the abdomen or pelvis. Uterus and ovaries are
unremarkable in appearance. Urinary bladder is normal in appearance.

Musculoskeletal: There are no aggressive appearing lytic or blastic
lesions noted in the visualized portions of the skeleton.
IMPRESSION: 1. No acute findings in the abdomen or pelvis to account for the
patient's symptoms.

## 2017-08-02 ENCOUNTER — Encounter (HOSPITAL_COMMUNITY): Payer: Self-pay | Admitting: Emergency Medicine

## 2017-08-02 ENCOUNTER — Emergency Department (HOSPITAL_COMMUNITY)
Admission: EM | Admit: 2017-08-02 | Discharge: 2017-08-03 | Disposition: A | Discharge status: Home / Self Care | Payer: Managed Care, Other (non HMO) | Attending: Emergency Medicine | Admitting: Emergency Medicine

## 2017-08-02 ENCOUNTER — Emergency Department (HOSPITAL_COMMUNITY): Payer: Managed Care, Other (non HMO)

## 2017-08-02 DIAGNOSIS — N946 Dysmenorrhea, unspecified: Secondary | ICD-10-CM | POA: Diagnosis not present

## 2017-08-02 DIAGNOSIS — Z87891 Personal history of nicotine dependence: Secondary | ICD-10-CM | POA: Diagnosis not present

## 2017-08-02 DIAGNOSIS — Z79899 Other long term (current) drug therapy: Secondary | ICD-10-CM | POA: Diagnosis not present

## 2017-08-02 DIAGNOSIS — R102 Pelvic and perineal pain: Secondary | ICD-10-CM | POA: Diagnosis present

## 2017-08-02 LAB — WET PREP, GENITAL
Sperm: NONE SEEN
Trich, Wet Prep: NONE SEEN
Yeast Wet Prep HPF POC: NONE SEEN

## 2017-08-02 LAB — POC URINE PREG, ED: PREG TEST UR: NEGATIVE

## 2017-08-02 MED ORDER — ONDANSETRON 4 MG PO TBDP
4.0000 mg | ORAL_TABLET | Freq: Once | ORAL | Status: AC
  Administered 2017-08-02: 4 mg via ORAL
  Filled 2017-08-02: qty 1

## 2017-08-02 MED ORDER — ACETAMINOPHEN 325 MG PO TABS
650.0000 mg | ORAL_TABLET | Freq: Once | ORAL | Status: AC
  Administered 2017-08-02: 650 mg via ORAL
  Filled 2017-08-02: qty 2

## 2017-08-02 NOTE — ED Triage Notes (Signed)
Pt started first period today since having one in several months after coming off birth control and having really bad abd pains with n/v and diarrhea. Pt unable to get into GYN today.

## 2017-08-02 NOTE — ED Provider Notes (Signed)
[facility]-EMERGENCY DEPT Provider Note   CSN: [id number] Arrival date & time: 08/02/17  1520     History   Chief Complaint Chief Complaint  Patient presents with  . Menstrual Problem    HPI Chelsea Jimenez is a 27 y.o. female.  HPI   27 year old female with history of polycystic ovarian syndrome, anxiety, depression, recurrent urinary tract infection presenting for evaluation of abdominal pain and vaginal bleeding.  Patient states she was on birth control medication for several years and has had several miscarriage while being on birth control.  She has been off her birth control for at least 7 months and has not had a menstrual cycle until today.  States that she was on her way to work when she developed acute onset of sharp throbbing pain to her lower abdomen followed by vaginal bleeding.  Bleeding initially was intense but it has since improved.  She felt nauseous, weak, with bouts of vomiting and diarrhea.  Her abdominal pain since improved but still rates at 8 out of 10.  No fever, chills, chest pain, trouble breathing, dysuria, vaginal discharge or rash.  She denies any recent strenuous activities.  She was unable to follow-up with her OB/GYN and decided to come here instead.  Past Medical History:  Diagnosis Date  . Allergy   . Anxiety   . Depression   . Heart murmur   . PCOS (polycystic ovarian syndrome) dx 2010  . UTI (lower urinary tract infection)     Patient Active Problem List   Diagnosis Date Noted  . Abdominal pain, unspecified site 10/01/2013  . Bloating 10/01/2013  . Nausea alone 10/01/2013  . Anal fissure 10/01/2013    History reviewed. No pertinent surgical history.   OB History   None      Home Medications    Prior to Admission medications   Medication Sig Start Date End Date Taking? Authorizing Provider  dicyclomine (BENTYL) 20 MG tablet Take 1 tablet (20 mg total) by mouth 4 (four) times daily -  before meals and at  bedtime. 09/08/13   Copland, Gwenlyn Found, MD  diltiazem 2 % GEL Apply inside rectum up to first knuckle 2-3 times daily 10/01/13   Zehr, Jessica D, PA-C  Lidocaine, Anorectal, (RECTICARE) 5 % CREA Apply rectally 2-3 times daily until you get diltizem gel 10/01/13   Zehr, Shanda Bumps D, PA-C  norethindrone-ethinyl estradiol-iron (MICROGESTIN FE,GILDESS FE,LOESTRIN FE) 1.5-30 MG-MCG tablet Take 1 tablet by mouth daily.    [provider]  ondansetron (ZOFRAN) 8 MG tablet Take 1 tablet (8 mg total) by mouth every 8 (eight) hours as needed for nausea or vomiting. 10/01/13   Zehr, Princella Pellegrini, PA-C  sucralfate (CARAFATE) 1 G tablet Take 1 tablet (1 g total) by mouth 4 (four) times daily -  with meals and at bedtime. 09/17/13   Copland, Gwenlyn Found, MD  traZODone (DESYREL) 50 MG tablet Take 1 tablet (50 mg total) by mouth at bedtime. 09/08/13   Copland, Gwenlyn Found, MD    Family History Family History  Problem Relation Age of Onset  . Hypertension Mother   . Diabetes Mother   . Heart disease Maternal Grandmother   . Stroke Maternal Grandmother   . Breast cancer Maternal Grandmother        great  . Diabetes Maternal Grandmother   . Moyamoya disease Maternal Grandmother   . Heart disease Maternal Grandfather   . Diabetes Maternal Grandfather   . Ovarian cancer Paternal Grandmother   .  Pancreatic cancer Paternal Grandfather   . Diabetes Paternal Grandfather     Social History Social History   Tobacco Use  . Smoking status: Former Smoker    Packs/day: 0.25    Years: 2.00    Pack years: 0.50    Types: Cigarettes    Last attempt to quit: 08/22/2010    Years since quitting: 6.9  . Smokeless tobacco: Never Used  Substance Use Topics  . Alcohol use: Yes    Comment: occasional  . Drug use: No     Allergies   Morphine and related; Shellfish allergy; and Sulfa antibiotics   Review of Systems Review of Systems  All other systems reviewed and are negative.    Physical Exam Updated Vital  Signs BP 129/78 (BP Location: Right Arm)   Pulse 91   Temp 99.1 F (37.3 C) (Oral)   Resp 18   Wt 85.7 kg (189 lb)   LMP 08/02/2017   SpO2 98%   BMI 32.95 kg/m   Physical Exam  Constitutional: She appears well-developed and well-nourished. No distress.  HENT:  Head: Atraumatic.  Eyes: Conjunctivae are normal.  Neck: Neck supple.  Cardiovascular: Normal rate and regular rhythm.  Pulmonary/Chest: Effort normal and breath sounds normal.  Abdominal: Soft. Bowel sounds are normal. She exhibits no distension. There is tenderness (Diffuse abdominal tenderness without guarding or rebound tenderness.  No focal point tenderness.).  Genitourinary:  Genitourinary Comments: Chaperone present during exam.  Normal external genitalia.  No inguinal lymphadenopathy or inguinal hernia noted.  Mild discomfort with speculum insertion.  Blood noted in vaginal vault.  Closed cervical os free of lesion or rash.  On bimanual examination, bilateral adnexal tenderness without cervical motion tenderness.  Neurological: She is alert.  Skin: No rash noted.  Psychiatric: She has a normal mood and affect.  Nursing note and vitals reviewed.    ED Treatments / Results  Labs (all labs ordered are listed, but only abnormal results are displayed) Labs Reviewed  WET PREP, GENITAL - Abnormal; Notable for the following components:      Result Value   Clue Cells Wet Prep HPF POC PRESENT (*)    WBC, Wet Prep HPF POC FEW (*)    All other components within normal limits  HIV ANTIBODY (ROUTINE TESTING)  RPR  POC URINE PREG, ED  GC/CHLAMYDIA PROBE AMP ([place]) NOT AT [facility]    EKG None  Radiology US Transvaginal Non-ob  Result Date: 08/03/2017 CLINICAL DATA:  Initial evaluation for acute pelvic pain, vaginal bleeding. History of PCOS EXAM: TRANSABDOMINAL AND TRANSVAGINAL ULTRASOUND OF PELVIS DOPPLER ULTRASOUND OF OVARIES TECHNIQUE: Both transabdominal and transvaginal ultrasound examinations of the pelvis  were performed. Transabdominal technique was performed for global imaging of the pelvis including uterus, ovaries, adnexal regions, and pelvic cul-de-sac. It was necessary to proceed with endovaginal exam following the transabdominal exam to visualize the uterus, endometrium, and ovaries. Color and duplex Doppler ultrasound was utilized to evaluate blood flow to the ovaries. COMPARISON:  Prior CT from 10/02/2013 FINDINGS: Uterus Measurements: 7.1 x 2.7 x 3.7 cm. No fibroids or other mass visualized. 4 mm nabothian cyst noted at the cervix. Endometrium Thickness: 2.9 mm.  No focal abnormality visualized. Right ovary Measurements: 2.9 x 2.5 x 2.6 cm. Normal appearance/no adnexal mass. Left ovary Measurements: 3.1 x 1.8 x 1.9 cm. Normal appearance/no adnexal mass. Pulsed Doppler evaluation of both ovaries demonstrates normal low-resistance arterial and venous waveforms. Other findings No abnormal free fluid. IMPRESSION: Normal pelvic ultrasound. No evidence  for torsion or other acute abnormality. Electronically Signed   By: Rise MuBenjamin  McClintock M.D.   On: 08/03/2017 00:14   [place] Pelvis Complete  Result Date: 08/03/2017 CLINICAL DATA:  Initial evaluation for acute pelvic pain, vaginal bleeding. History of PCOS EXAM: TRANSABDOMINAL AND TRANSVAGINAL ULTRASOUND OF PELVIS DOPPLER ULTRASOUND OF OVARIES TECHNIQUE: Both transabdominal and transvaginal ultrasound examinations of the pelvis were performed. Transabdominal technique was performed for global imaging of the pelvis including uterus, ovaries, adnexal regions, and pelvic cul-de-sac. It was necessary to proceed with endovaginal exam following the transabdominal exam to visualize the uterus, endometrium, and ovaries. Color and duplex Doppler ultrasound was utilized to evaluate blood flow to the ovaries. COMPARISON:  Prior CT from 10/02/2013 FINDINGS: Uterus Measurements: 7.1 x 2.7 x 3.7 cm. No fibroids or other mass visualized. 4 mm nabothian cyst noted at the cervix.  Endometrium Thickness: 2.9 mm.  No focal abnormality visualized. Right ovary Measurements: 2.9 x 2.5 x 2.6 cm. Normal appearance/no adnexal mass. Left ovary Measurements: 3.1 x 1.8 x 1.9 cm. Normal appearance/no adnexal mass. Pulsed Doppler evaluation of both ovaries demonstrates normal low-resistance arterial and venous waveforms. Other findings No abnormal free fluid. IMPRESSION: Normal pelvic ultrasound. No evidence for torsion or other acute abnormality. Electronically Signed   By: Rise MuBenjamin  McClintock M.D.   On: 08/03/2017 00:14   [place] Art/ven Flow Abd Pelv Doppler  Result Date: 08/03/2017 CLINICAL DATA:  Initial evaluation for acute pelvic pain, vaginal bleeding. History of PCOS EXAM: TRANSABDOMINAL AND TRANSVAGINAL ULTRASOUND OF PELVIS DOPPLER ULTRASOUND OF OVARIES TECHNIQUE: Both transabdominal and transvaginal ultrasound examinations of the pelvis were performed. Transabdominal technique was performed for global imaging of the pelvis including uterus, ovaries, adnexal regions, and pelvic cul-de-sac. It was necessary to proceed with endovaginal exam following the transabdominal exam to visualize the uterus, endometrium, and ovaries. Color and duplex Doppler ultrasound was utilized to evaluate blood flow to the ovaries. COMPARISON:  Prior CT from 10/02/2013 FINDINGS: Uterus Measurements: 7.1 x 2.7 x 3.7 cm. No fibroids or other mass visualized. 4 mm nabothian cyst noted at the cervix. Endometrium Thickness: 2.9 mm.  No focal abnormality visualized. Right ovary Measurements: 2.9 x 2.5 x 2.6 cm. Normal appearance/no adnexal mass. Left ovary Measurements: 3.1 x 1.8 x 1.9 cm. Normal appearance/no adnexal mass. Pulsed Doppler evaluation of both ovaries demonstrates normal low-resistance arterial and venous waveforms. Other findings No abnormal free fluid. IMPRESSION: Normal pelvic ultrasound. No evidence for torsion or other acute abnormality. Electronically Signed   By: Rise MuBenjamin  McClintock M.D.   On: 08/03/2017  00:14    Procedures Procedures (including critical care time)  Medications Ordered in ED Medications  ondansetron (ZOFRAN-ODT) disintegrating tablet 4 mg (4 mg Oral Given 08/02/17 2115)  acetaminophen (TYLENOL) tablet 650 mg (650 mg Oral Given 08/02/17 2115)     Initial Impression / Assessment and Plan / ED Course  I have reviewed the triage vital signs and the nursing notes.  Pertinent labs & imaging results that were available during my care of the patient were reviewed by me and considered in my medical decision making (see chart for details).     BP (!) 90/58   Pulse 88   Temp 99.1 F (37.3 C) (Oral)   Resp 18   Wt 85.7 kg (189 lb)   LMP 08/02/2017   SpO2 99%   BMI 32.95 kg/m    Final Clinical Impressions(s) / ED Diagnoses   Final diagnoses:  Dysmenorrhea    ED Discharge Orders  Ordered    ibuprofen (ADVIL,MOTRIN) 200 MG tablet  Every 6 hours PRN     08/03/17 0024     9:10 PM History of PCOS here with abdominal pain and vaginal bleeding after not having her menstruation for at least 7 months.  She does have diffuse abdominal tenderness on exam, will perform pelvic examination and check pregnancy test.  She may benefit from a pelvic ultrasound if indicated.  10:19 PM Patient with vaginal bleeding and pelvic discomfort on pelvic examination.  Plan to obtain pelvic ultrasound to rule out torsion.  12:29 AM Pelvic US without acute finding, no evidence of ovarian torsion.  Wet prep showing presence of clue cell and a few WBC.  However, pt doesn't have other sxs to suggest BV.  preg test negative.  HIV and RPR pending.  At this time, I recommend pt to f/u with [facility] for further evaluation and management of her condition.  Ibuprofen prescribed.  Return precaution given.  Likely dysmenorrhea.   Fayrene Helper, PA-C 08/03/17 0031    Melene Plan, DO 08/03/17 1621

## 2017-08-03 LAB — HIV ANTIBODY (ROUTINE TESTING W REFLEX): HIV SCREEN 4TH GENERATION: NONREACTIVE

## 2017-08-03 MED ORDER — ONDANSETRON HCL 4 MG PO TABS: 4.0000 mg | ORAL_TABLET | Freq: Three times a day (TID) | ORAL | 0 refills | Status: DC | PRN

## 2017-08-03 MED ORDER — IBUPROFEN 200 MG PO TABS: 800.0000 mg | ORAL_TABLET | Freq: Four times a day (QID) | ORAL | 0 refills | Status: DC | PRN

## 2017-08-03 NOTE — Discharge Instructions (Signed)
Take ibuprofen as needed for pain.  [facility] management of your symptoms.

## 2017-08-05 LAB — RPR: RPR: NONREACTIVE

## 2017-08-06 LAB — GC/CHLAMYDIA PROBE AMP (~~LOC~~) NOT AT ARMC
Chlamydia: NEGATIVE
Neisseria Gonorrhea: NEGATIVE

## 2018-02-27 NOTE — L&D Delivery Note (Signed)
Delivery Note [id number] [redacted]w[redacted]d    Complete dilation at 1213 Onset of pushing at 1400, with 30 min rest period FHR second stage Cat 2  Analgesia/Anesthesia intrapartum: Epidural  Guided, closed-glottis pushing initiated when pt reports feeling rectal pressure w/ ctx. Delivery of a viable female at 36 by Clois Dupes, CNM w/ Dr. Cletis Media in attendance. Delivery of head with the force of two ctx's in OA position and restituted to LOT. No palpable nuchal cord. Gentle axial traction applied and spont delivery of ant shoulder. Infant placed on maternal abd. Cord double clamped after 1 min and cut by father, Sonia Side.  Cord blood sample collected Arterial cord blood sample N/A.  Placenta delivered Schultz side, intact, with 3 VC.  Placenta examined and sent to L&D. Uterine tone firm, bleeding scant  Periurethral laceration identified.  Anesthesia: Epidural Repair 4-0 SH Vicryl QBL (mL): 58   Complications: Highest maternal temp 101.3, treated w/ Unasyn 3G x1 dose postpartum  APGAR: 8, 9 Mom to postpartum.  Baby to Couplet care / Skin to Skin.  Arrie Eastern MSN, CNM 11/26/2018, 5:41 PM

## 2018-08-20 IMAGING — US US PELVIS COMPLETE
1 series · 13 of 25 positions shown · non-contrast
Comparison: Prior CT from 10/02/2013

CLINICAL DATA: Initial evaluation for acute pelvic pain, vaginal
bleeding. History of PCOS

EXAM:
TRANSABDOMINAL AND TRANSVAGINAL ULTRASOUND OF PELVIS
DOPPLER ULTRASOUND OF OVARIES
TECHNIQUE: Both transabdominal and transvaginal ultrasound examinations of the
pelvis were performed. Transabdominal technique was performed for
global imaging of the pelvis including uterus, ovaries, adnexal
regions, and pelvic cul-de-sac.
It was necessary to proceed with endovaginal exam following the
transabdominal exam to visualize the uterus, endometrium, and
ovaries. Color and duplex Doppler ultrasound was utilized to
evaluate blood flow to the ovaries.

[Series 1: us pelvis complete · 13 of 84 slices shown]
[im 1/84]
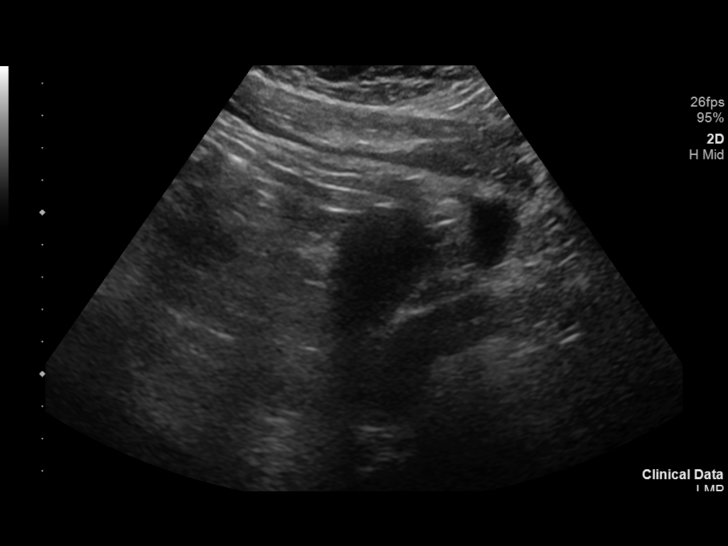
[im 7/84]
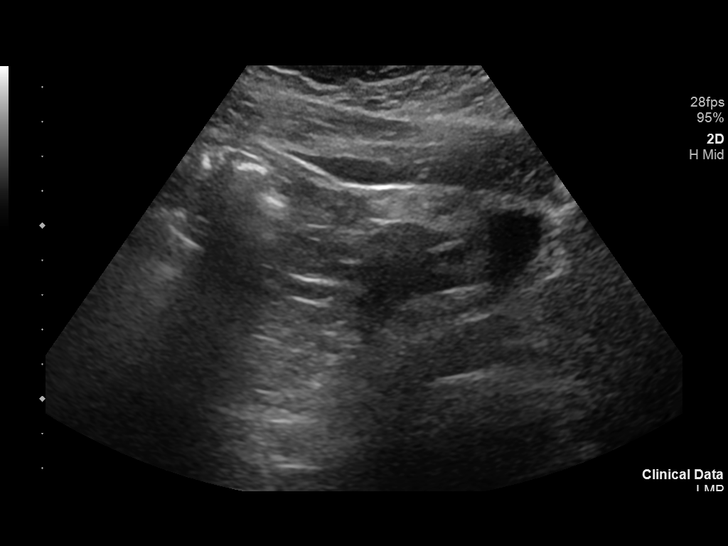
[im 14/84]
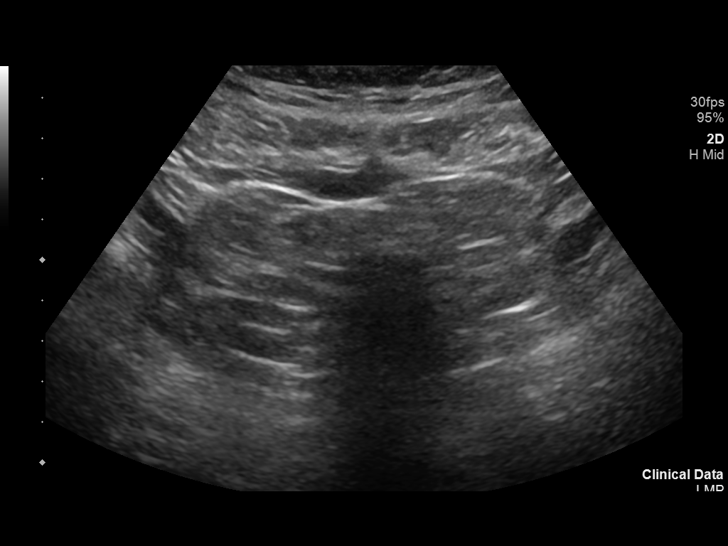
[im 21/84]
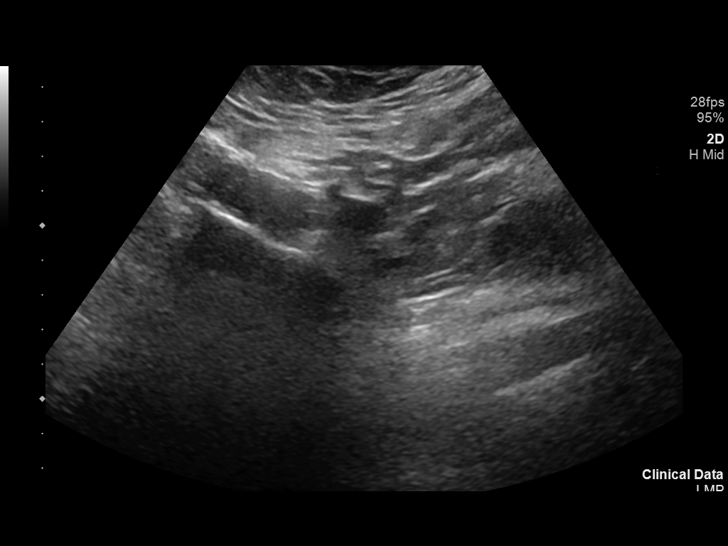
[im 28/84]
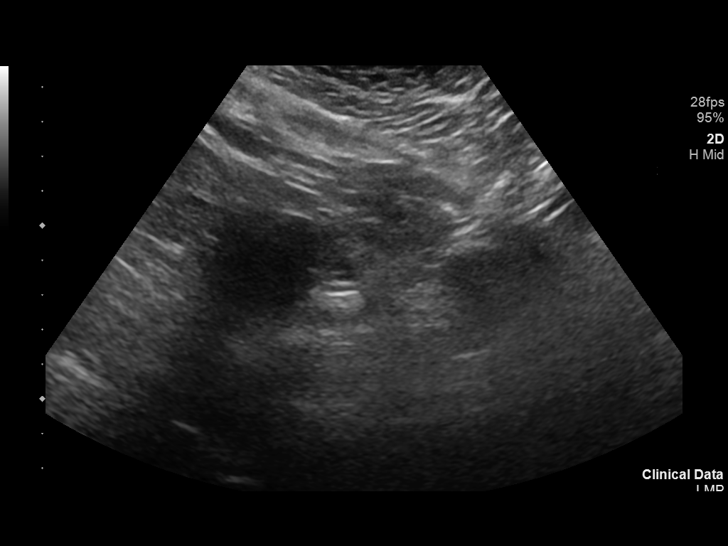
[im 35/84]
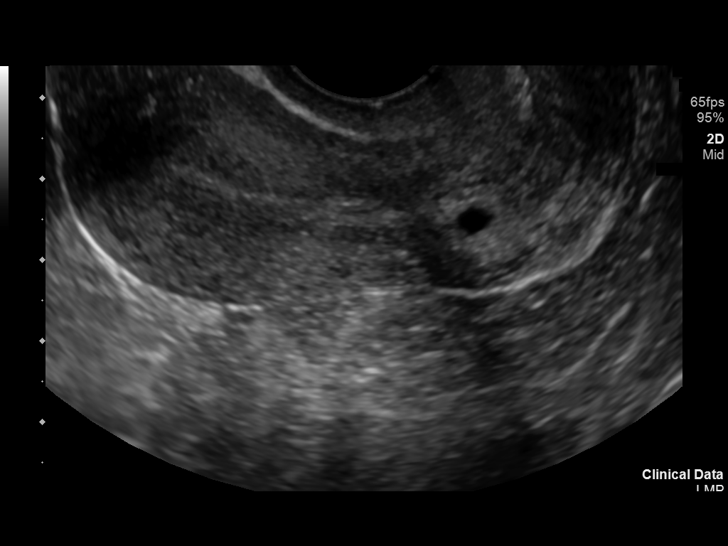
[im 42/84]
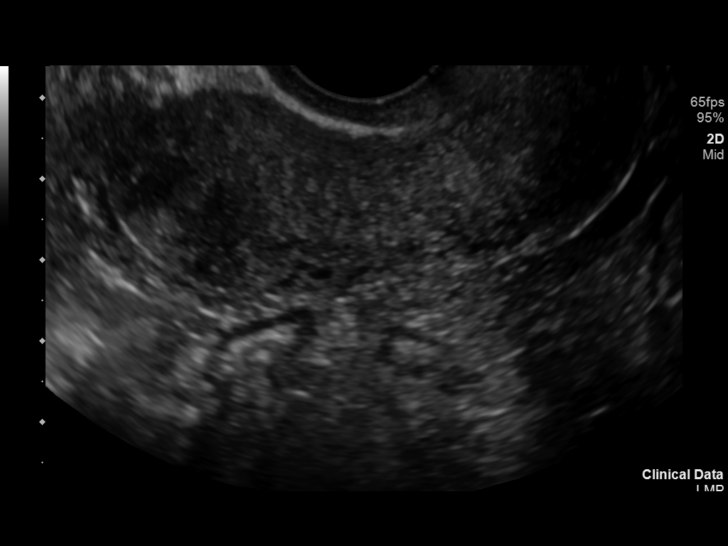
[im 49/84]
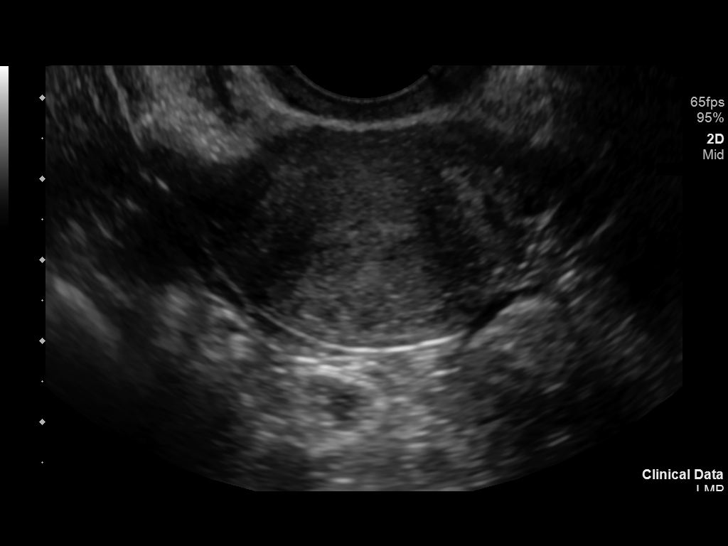
[im 56/84]
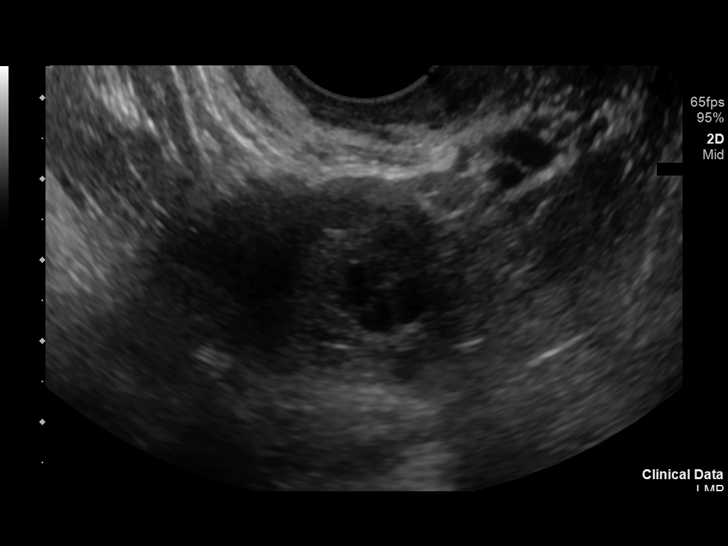
[im 63/84]
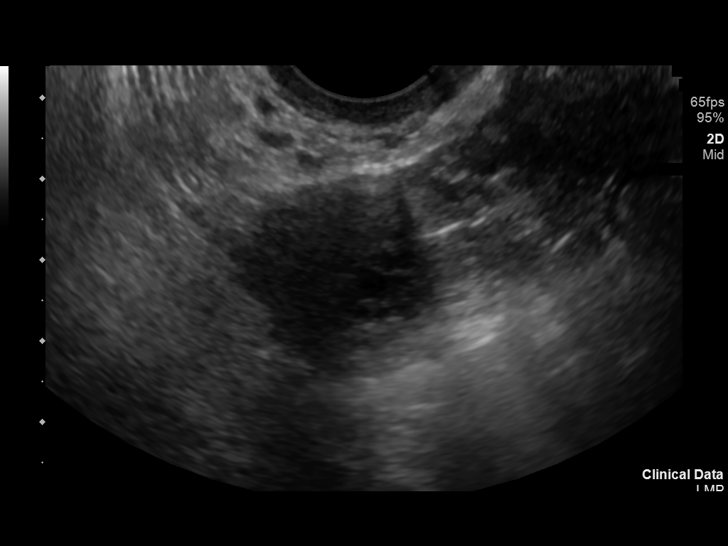
[im 70/84]
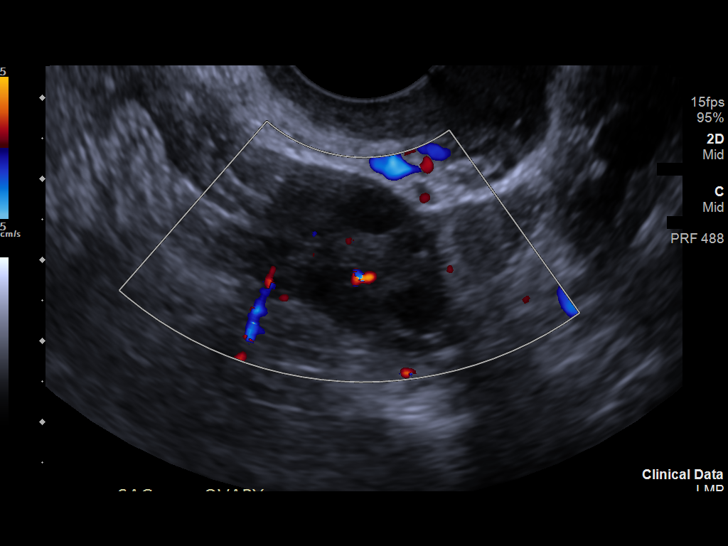
[im 77/84]
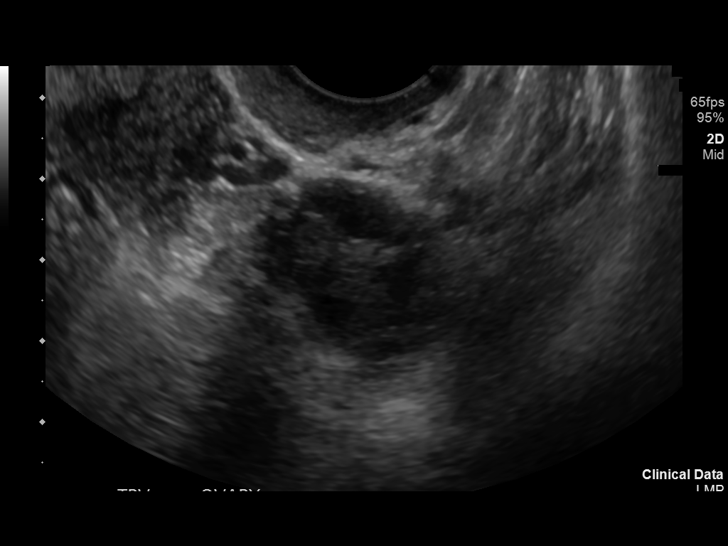
[im 84/84]
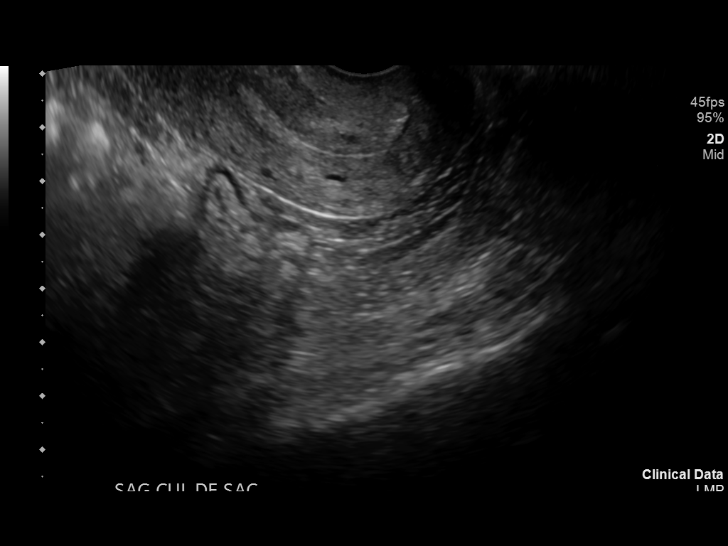

[13 of 25 positions shown; findings below may reference images not displayed]

FINDINGS: Uterus

Measurements: 7.1 x 2.7 x 3.7 cm. No fibroids or other mass
visualized. 4 mm nabothian cyst noted at the cervix.

Endometrium

Thickness: 2.9 mm.  No focal abnormality visualized.

Right ovary

Measurements: 2.9 x 2.5 x 2.6 cm. Normal appearance/no adnexal mass.

Left ovary

Measurements: 3.1 x 1.8 x 1.9 cm. Normal appearance/no adnexal mass.

Pulsed Doppler evaluation of both ovaries demonstrates normal
low-resistance arterial and venous waveforms.

Other findings

No abnormal free fluid.
IMPRESSION: Normal pelvic ultrasound. No evidence for torsion or other acute
abnormality.

## 2018-10-30 ENCOUNTER — Encounter: Payer: Medicaid Other | Attending: Obstetrics and Gynecology | Admitting: Registered"

## 2018-10-30 ENCOUNTER — Other Ambulatory Visit: Payer: Self-pay

## 2018-10-30 ENCOUNTER — Encounter: Payer: Self-pay | Admitting: Registered"

## 2018-10-30 DIAGNOSIS — O99810 Abnormal glucose complicating pregnancy: Secondary | ICD-10-CM | POA: Diagnosis present

## 2018-10-30 NOTE — Progress Notes (Signed)
Patient was seen on 10/30/2018 for Gestational Diabetes self-management class at the [facility]. The following learning objectives were met by the patient during this course:   States the definition of Gestational Diabetes  States why dietary management is important in controlling blood glucose  Describes the effects each nutrient has on blood glucose levels  Demonstrates ability to create a balanced meal plan  Demonstrates carbohydrate counting   States when to check blood glucose levels  Demonstrates proper blood glucose monitoring techniques  States the effect of stress and exercise on blood glucose levels  States the importance of limiting caffeine and abstaining from alcohol and smoking  Blood glucose monitor given: none  Patient instructed to monitor glucose levels: FBS: 60 - <95; 1 hour: <140; 2 hour: <120  Patient received handouts:  Nutrition Diabetes and Pregnancy, including carb counting list  Patient will be seen for follow-up as needed.

## 2018-11-26 ENCOUNTER — Other Ambulatory Visit: Payer: Self-pay

## 2018-11-26 ENCOUNTER — Inpatient Hospital Stay (HOSPITAL_COMMUNITY): Payer: Medicaid Other | Admitting: Anesthesiology

## 2018-11-26 ENCOUNTER — Inpatient Hospital Stay (HOSPITAL_COMMUNITY)
Admission: AD | Admit: 2018-11-26 | Discharge: 2018-11-28 | DRG: 805 | Disposition: A | Discharge status: Home / Self Care | Payer: Medicaid Other | Attending: Obstetrics and Gynecology | Admitting: Obstetrics and Gynecology

## 2018-11-26 ENCOUNTER — Encounter (HOSPITAL_COMMUNITY): Payer: Self-pay | Admitting: *Deleted

## 2018-11-26 DIAGNOSIS — O24429 Gestational diabetes mellitus in childbirth, unspecified control: Principal | ICD-10-CM | POA: Diagnosis present

## 2018-11-26 DIAGNOSIS — Z3A36 36 weeks gestation of pregnancy: Secondary | ICD-10-CM | POA: Diagnosis not present

## 2018-11-26 DIAGNOSIS — Z20828 Contact with and (suspected) exposure to other viral communicable diseases: Secondary | ICD-10-CM | POA: Diagnosis present

## 2018-11-26 DIAGNOSIS — Z87891 Personal history of nicotine dependence: Secondary | ICD-10-CM | POA: Diagnosis not present

## 2018-11-26 DIAGNOSIS — O24419 Gestational diabetes mellitus in pregnancy, unspecified control: Secondary | ICD-10-CM

## 2018-11-26 LAB — TYPE AND SCREEN
ABO/RH(D): A POS
Antibody Screen: NEGATIVE

## 2018-11-26 LAB — CBC
HCT: 37.6 % (ref 36.0–46.0)
Hemoglobin: 12.1 g/dL (ref 12.0–15.0)
MCH: 30 pg (ref 26.0–34.0)
MCHC: 32.2 g/dL (ref 30.0–36.0)
MCV: 93.3 fL (ref 80.0–100.0)
Platelets: 241 10*3/uL (ref 150–400)
RBC: 4.03 MIL/uL (ref 3.87–5.11)
RDW: 13 % (ref 11.5–15.5)
WBC: 16.9 10*3/uL — ABNORMAL HIGH (ref 4.0–10.5)
nRBC: 0 % (ref 0.0–0.2)

## 2018-11-26 LAB — GLUCOSE, CAPILLARY
Glucose-Capillary: 125 mg/dL — ABNORMAL HIGH (ref 70–99)
Glucose-Capillary: 122 mg/dL — ABNORMAL HIGH (ref 70–99)

## 2018-11-26 LAB — OB RESULTS CONSOLE ABO/RH: RH Type: POSITIVE

## 2018-11-26 LAB — OB RESULTS CONSOLE ANTIBODY SCREEN: Antibody Screen: NEGATIVE

## 2018-11-26 LAB — OB RESULTS CONSOLE GC/CHLAMYDIA
Chlamydia: NEGATIVE
Gonorrhea: NEGATIVE

## 2018-11-26 LAB — SARS CORONAVIRUS 2 BY RT PCR (HOSPITAL ORDER, PERFORMED IN ~~LOC~~ HOSPITAL LAB): SARS Coronavirus 2: NEGATIVE

## 2018-11-26 LAB — OB RESULTS CONSOLE RUBELLA ANTIBODY, IGM: Rubella: IMMUNE

## 2018-11-26 LAB — OB RESULTS CONSOLE HIV ANTIBODY (ROUTINE TESTING): HIV: NONREACTIVE

## 2018-11-26 LAB — ABO/RH: ABO/RH(D): A POS

## 2018-11-26 LAB — OB RESULTS CONSOLE RPR: RPR: NONREACTIVE

## 2018-11-26 LAB — GROUP B STREP BY PCR: Group B strep by PCR: NEGATIVE

## 2018-11-26 LAB — OB RESULTS CONSOLE HEPATITIS B SURFACE ANTIGEN: Hepatitis B Surface Ag: NEGATIVE

## 2018-11-26 MED ORDER — SODIUM CHLORIDE 0.9 % IV SOLN: 2.0000 g | INTRAVENOUS | Status: DC

## 2018-11-26 MED ORDER — SIMETHICONE 80 MG PO CHEW: 80.0000 mg | CHEWABLE_TABLET | ORAL | Status: DC | PRN

## 2018-11-26 MED ORDER — ONDANSETRON HCL 4 MG/2ML IJ SOLN: 4.0000 mg | INTRAMUSCULAR | Status: DC | PRN

## 2018-11-26 MED ORDER — OXYTOCIN BOLUS FROM INFUSION
500.0000 mL | Freq: Once | INTRAVENOUS | Status: AC
  Administered 2018-11-26: 500 mL via INTRAVENOUS

## 2018-11-26 MED ORDER — SENNOSIDES-DOCUSATE SODIUM 8.6-50 MG PO TABS
2.0000 | ORAL_TABLET | ORAL | Status: DC
  Administered 2018-11-27: 2 via ORAL
  Filled 2018-11-26 (×2): qty 2

## 2018-11-26 MED ORDER — DIPHENHYDRAMINE HCL 25 MG PO CAPS: 25.0000 mg | ORAL_CAPSULE | Freq: Four times a day (QID) | ORAL | Status: DC | PRN

## 2018-11-26 MED ORDER — OXYCODONE-ACETAMINOPHEN 5-325 MG PO TABS: 2.0000 | ORAL_TABLET | ORAL | Status: DC | PRN

## 2018-11-26 MED ORDER — LIDOCAINE HCL (PF) 1 % IJ SOLN
INTRAMUSCULAR | Status: DC | PRN
  Administered 2018-11-26: 11 mL via EPIDURAL

## 2018-11-26 MED ORDER — IBUPROFEN 600 MG PO TABS
600.0000 mg | ORAL_TABLET | Freq: Four times a day (QID) | ORAL | Status: DC
  Administered 2018-11-26 – 2018-11-28 (×7): 600 mg via ORAL
  Filled 2018-11-26 (×7): qty 1

## 2018-11-26 MED ORDER — OXYTOCIN 40 UNITS IN NORMAL SALINE INFUSION - SIMPLE MED: 2.5000 [IU]/h | INTRAVENOUS | Status: DC

## 2018-11-26 MED ORDER — FENTANYL CITRATE (PF) 100 MCG/2ML IJ SOLN
INTRAMUSCULAR | Status: AC
  Filled 2018-11-26: qty 2

## 2018-11-26 MED ORDER — DIBUCAINE (PERIANAL) 1 % EX OINT: 1.0000 "application " | TOPICAL_OINTMENT | CUTANEOUS | Status: DC | PRN

## 2018-11-26 MED ORDER — FENTANYL-BUPIVACAINE-NACL 0.5-0.125-0.9 MG/250ML-% EP SOLN: 12.0000 mL/h | EPIDURAL | Status: DC | PRN

## 2018-11-26 MED ORDER — LIDOCAINE HCL (PF) 1 % IJ SOLN: 30.0000 mL | INTRAMUSCULAR | Status: DC | PRN

## 2018-11-26 MED ORDER — SODIUM CHLORIDE 0.9 % IV SOLN: 2.0000 g | Freq: Once | INTRAVENOUS | Status: DC

## 2018-11-26 MED ORDER — BENZOCAINE-MENTHOL 20-0.5 % EX AERO: 1.0000 "application " | INHALATION_SPRAY | CUTANEOUS | Status: DC | PRN

## 2018-11-26 MED ORDER — FENTANYL CITRATE (PF) 100 MCG/2ML IJ SOLN
100.0000 ug | INTRAMUSCULAR | Status: DC | PRN
  Administered 2018-11-26: 10:00:00 100 ug via INTRAVENOUS

## 2018-11-26 MED ORDER — ONDANSETRON HCL 4 MG/2ML IJ SOLN: 4.0000 mg | Freq: Four times a day (QID) | INTRAMUSCULAR | Status: DC | PRN

## 2018-11-26 MED ORDER — SODIUM CHLORIDE (PF) 0.9 % IJ SOLN
INTRAMUSCULAR | Status: DC | PRN
  Administered 2018-11-26: 12 mL/h via EPIDURAL

## 2018-11-26 MED ORDER — OXYCODONE-ACETAMINOPHEN 5-325 MG PO TABS: 1.0000 | ORAL_TABLET | ORAL | Status: DC | PRN

## 2018-11-26 MED ORDER — ACETAMINOPHEN 325 MG PO TABS
650.0000 mg | ORAL_TABLET | ORAL | Status: DC | PRN
  Filled 2018-11-26: qty 2

## 2018-11-26 MED ORDER — LACTATED RINGERS IV SOLN
500.0000 mL | Freq: Once | INTRAVENOUS | Status: AC
  Administered 2018-11-26: 500 mL via INTRAVENOUS

## 2018-11-26 MED ORDER — LACTATED RINGERS IV SOLN
INTRAVENOUS | Status: DC
  Administered 2018-11-26 (×2): via INTRAVENOUS

## 2018-11-26 MED ORDER — PHENYLEPHRINE 40 MCG/ML (10ML) SYRINGE FOR IV PUSH (FOR BLOOD PRESSURE SUPPORT): 80.0000 ug | PREFILLED_SYRINGE | INTRAVENOUS | Status: DC | PRN

## 2018-11-26 MED ORDER — FENTANYL-BUPIVACAINE-NACL 0.5-0.125-0.9 MG/250ML-% EP SOLN
EPIDURAL | Status: AC
  Filled 2018-11-26: qty 250

## 2018-11-26 MED ORDER — OXYTOCIN 40 UNITS IN NORMAL SALINE INFUSION - SIMPLE MED
1.0000 m[IU]/min | INTRAVENOUS | Status: DC
  Administered 2018-11-26: 15:00:00 2 m[IU]/min via INTRAVENOUS

## 2018-11-26 MED ORDER — CALCIUM CARBONATE ANTACID 500 MG PO CHEW: 2.0000 | CHEWABLE_TABLET | Freq: Two times a day (BID) | ORAL | Status: DC | PRN

## 2018-11-26 MED ORDER — FENTANYL CITRATE (PF) 100 MCG/2ML IJ SOLN: 50.0000 ug | INTRAMUSCULAR | Status: DC | PRN

## 2018-11-26 MED ORDER — LACTATED RINGERS IV SOLN: 500.0000 mL | INTRAVENOUS | Status: DC | PRN

## 2018-11-26 MED ORDER — FLEET ENEMA 7-19 GM/118ML RE ENEM: 1.0000 | ENEMA | Freq: Every day | RECTAL | Status: DC | PRN

## 2018-11-26 MED ORDER — EPHEDRINE 5 MG/ML INJ: 10.0000 mg | INTRAVENOUS | Status: DC | PRN

## 2018-11-26 MED ORDER — COCONUT OIL OIL
1.0000 "application " | TOPICAL_OIL | Status: DC | PRN
  Administered 2018-11-27: 1 via TOPICAL

## 2018-11-26 MED ORDER — PRENATAL MULTIVITAMIN CH
1.0000 | ORAL_TABLET | Freq: Every day | ORAL | Status: DC
  Administered 2018-11-27 – 2018-11-28 (×2): 1 via ORAL
  Filled 2018-11-26 (×2): qty 1

## 2018-11-26 MED ORDER — SOD CITRATE-CITRIC ACID 500-334 MG/5ML PO SOLN
30.0000 mL | ORAL | Status: DC | PRN
  Administered 2018-11-26: 30 mL via ORAL
  Filled 2018-11-26: qty 30

## 2018-11-26 MED ORDER — TETANUS-DIPHTH-ACELL PERTUSSIS 5-2.5-18.5 LF-MCG/0.5 IM SUSP: 0.5000 mL | Freq: Once | INTRAMUSCULAR | Status: DC

## 2018-11-26 MED ORDER — WITCH HAZEL-GLYCERIN EX PADS: 1.0000 "application " | MEDICATED_PAD | CUTANEOUS | Status: DC | PRN

## 2018-11-26 MED ORDER — DIPHENHYDRAMINE HCL 50 MG/ML IJ SOLN: 12.5000 mg | INTRAMUSCULAR | Status: DC | PRN

## 2018-11-26 MED ORDER — SODIUM CHLORIDE 0.9 % IV SOLN
3.0000 g | Freq: Once | INTRAVENOUS | Status: AC
  Administered 2018-11-26: 3 g via INTRAVENOUS
  Filled 2018-11-26: qty 3

## 2018-11-26 MED ORDER — TERBUTALINE SULFATE 1 MG/ML IJ SOLN: 0.2500 mg | Freq: Once | INTRAMUSCULAR | Status: DC | PRN

## 2018-11-26 MED ORDER — ACETAMINOPHEN 500 MG PO TABS
1000.0000 mg | ORAL_TABLET | Freq: Once | ORAL | Status: AC
  Administered 2018-11-26: 1000 mg via ORAL
  Filled 2018-11-26: qty 2

## 2018-11-26 MED ORDER — ONDANSETRON HCL 4 MG PO TABS: 4.0000 mg | ORAL_TABLET | ORAL | Status: DC | PRN

## 2018-11-26 MED ORDER — ACETAMINOPHEN 325 MG PO TABS
650.0000 mg | ORAL_TABLET | ORAL | Status: DC | PRN
  Administered 2018-11-26 – 2018-11-28 (×8): 650 mg via ORAL
  Filled 2018-11-26 (×8): qty 2

## 2018-11-26 MED ORDER — OXYTOCIN 40 UNITS IN NORMAL SALINE INFUSION - SIMPLE MED
INTRAVENOUS | Status: AC
  Filled 2018-11-26: qty 1000

## 2018-11-26 NOTE — Progress Notes (Signed)
V. Maryla Morrow CNM notified of pt's arrival and status with VE change states we can admit pt

## 2018-11-26 NOTE — H&P (Addendum)
OB ADMISSION/ HISTORY & PHYSICAL:  Admission Date: 11/26/2018  7:57 AM  Admit Diagnosis: Spontaneous labor  Chelsea Jimenez [id number] at [redacted]w[redacted]d is a 28 y.o. female presenting for ctx since 2300 on 11/25/18. Denies LOF and reports active FM.   Prenatal History: G3P0020   EDC : 12/22/2018, Date entered prior to episode creation  [facility] at [facility]  Prenatal course complicated by GDM  Prenatal Labs: ABO, Rh: A pos Antibody: Neg Rubella: Immune (09/29 0000)  RPR: Nonreactive (09/29 0000)  HBsAg: Negative (09/29 0000)  HIV: Non-reactive (09/29 0000)  GTT: Failed 1 hr GBS: Negative    GC/CHL: Neg/ Neg  OB History  Gravida Para Term Preterm AB Living  3       2 0  SAB TAB Ectopic Multiple Live Births  2       0    # Outcome Date GA Lbr Len/2nd Weight Sex Delivery Anes PTL Lv  3 Current           2 SAB           1 SAB             Medical / Surgical History: Past medical history:  Past Medical History:  Diagnosis Date  . Allergy   . Anxiety   . Depression   . Heart murmur   . PCOS (polycystic ovarian syndrome) dx 2010  . UTI (lower urinary tract infection)     Past surgical history:  Past Surgical History:  Procedure Laterality Date  . NO PAST SURGERIES     Family History:  Family History  Problem Relation Age of Onset  . Hypertension Mother   . Diabetes Mother   . Heart disease Maternal Grandmother   . Stroke Maternal Grandmother   . Breast cancer Maternal Grandmother        great  . Diabetes Maternal Grandmother   . [place] disease Maternal Grandmother   . Heart disease Maternal Grandfather   . Diabetes Maternal Grandfather   . Ovarian cancer Paternal Grandmother   . Pancreatic cancer Paternal Grandfather   . Diabetes Paternal Grandfather     Social History:  reports that she quit smoking about 8 years ago. Her smoking use included cigarettes. She has a 0.50 pack-year smoking history. She has never used smokeless tobacco. She reports current alcohol  use. She reports previous drug use.  Allergies: Morphine and related, Shellfish allergy, and Sulfa antibiotics   Current Medications at time of admission:  Prior to Admission medications   Medication Sig Start Date End Date Taking? Authorizing Provider  prenatal vitamin w/FE, FA (PRENATAL 1 + 1) 27-1 MG TABS tablet Take 1 tablet by mouth daily at 12 noon.   Yes [provider]  ibuprofen (ADVIL,MOTRIN) 200 MG tablet Take 4 tablets (800 mg total) by mouth every 6 (six) hours as needed for headache or moderate pain. 08/03/17   Domenic Moras, PA-C  ondansetron (ZOFRAN) 4 MG tablet Take 1 tablet (4 mg total) by mouth every 8 (eight) hours as needed for nausea or vomiting. 08/03/17   Domenic Moras, PA-C    Review of Systems: active FM onset of ctx @2300  on 11/25/18 currently every 3-4 minutes Intact menbranes bloody show present  Physical Exam: VS: Blood pressure 116/66, pulse 95, temperature 98.2 F (36.8 C), temperature source Oral, resp. rate 20, height 5\' 4"  (1.626 m), weight 88 kg, SpO2 100 %. alert and oriented, appears in pain heart:RRR lung fields clear with ausculation abdomen soft and  non-tender, non-distended  uterus gravid and non-tender / appears stated GA extremities: trace edema  cervical exam:Dilation: 8 Effacement (%): 100 Station: 0, Plus 1 Exam by:: Johnathan Hausen RN   FHR: baseline rate 140 / variability mod / accelerations present / absent decelerations TOCO: 3-5 min, mod-strong per palpation  Assessment: G3P0020 @ [redacted]w[redacted]d Active stage of labor FHR category 1 GBS negative GDM A2   Plan:  Admit to L&D Routine admission orders Epidural PRN Blood glucose Q4 hours Dr Estanislado Pandy and Dr. Normand Sloop notified of admission and plan of care  Roma Schanz CNM, MSN 11/26/2018, 10:55 AM

## 2018-11-26 NOTE — Progress Notes (Signed)
Subjective:   Very comfortable w/ epidural. Reports feeling occasional vaginal pressure w/ ctx.   Objective:   VS: BP 107/61   Pulse 96   Temp 99.2 F (37.3 C) (Oral)   Resp 18   Ht 5\' 4"  (1.626 m)   Wt 88 kg   SpO2 99%   BMI 33.30 kg/m  FHR : baseline 145 / variability moderate / accelerations present / absent decelerations Toco: contractions every 3-5 minutes, mod-strong per palpation Membranes: Intact Dilation: 10 Dilation Complete Date: 11/26/18 Dilation Complete Time: 1213 Effacement (%): 100 Station: Plus 1 Presentation: Vertex Exam by:: Krystal Eaton RN   Assessment Kathyrn Lass:   Active labor FHR category 1 GBS negative Pain mngt Epidural Anticipate NSVD    Arrie Eastern MSN, CNM 11/26/2018, 1:33 PM

## 2018-11-26 NOTE — MAU Note (Signed)
.   Chelsea Jimenez is a 28 y.o. at [redacted]w[redacted]d here in MAU reporting: contractions since last night. Denies any vaginal bleeding or LOF. Pink discharge  Onset of complaint:11pm  Pain score: 9 Vitals:   11/26/18 0819  BP: 122/79  Pulse: 82  Resp: 20  Temp: 98.2 F (36.8 C)  SpO2: 98%     FHT:150 Lab orders placed from triage:

## 2018-11-26 NOTE — Progress Notes (Signed)
Subjective:   Remains comfortable w/ epidural. S/O, Sonia Side, @ bedside. Attempted AROM was unsuccessful and no palpable BOW. Pt states she may have experienced LOF while vomiting this AM. Pt endorses possible SROM @ 0930 today. Guided pushing initated @ 1400. Pt w/ minimal effort @ 1435. No change in station.   Objective:   VS: BP 109/65   Pulse (!) 101   Temp (!) 100.4 F (38 C) (Axillary)   Resp 18   Ht 5\' 4"  (1.626 m)   Wt 88 kg   SpO2 99%   BMI 33.30 kg/m  FHR : baseline 150 / variability moderate / accelerations absent / early decelerations IUPC: contractions every 3 minutes / MVU 150 Membranes: SROM 0930, no fluid seen Dilation: 10 Dilation Complete Date: 11/26/18 Dilation Complete Time: 1213 Effacement (%): 100 Station: Plus 2 Presentation: Vertex Exam by:: Burman Foster CNM  Assessment Kathyrn Lass:   Protraced 2nd stage labor     -Pitocin augmentation     -IUPC inserted by Dr. Cletis Media FHR category 1 GBS negative Pain mngt epidural Anticipate NSVD   Plan of care reviewed w/ Dr. Cletis Media at bedside.   Arrie Eastern CNM, MSN 11/26/2018, 3:13 PM

## 2018-11-26 NOTE — Anesthesia Procedure Notes (Signed)
Epidural Patient location during procedure: OB Start time: 11/26/2018 10:08 AM End time: 11/26/2018 10:21 AM  Staffing Anesthesiologist: Lynda Rainwater, MD Performed: anesthesiologist   Preanesthetic Checklist Completed: patient identified, site marked, surgical consent, pre-op evaluation, timeout performed, IV checked, risks and benefits discussed and monitors and equipment checked  Epidural Patient position: sitting Prep: ChloraPrep Patient monitoring: heart rate, cardiac monitor, continuous pulse ox and blood pressure Approach: midline Location: L2-L3 Injection technique: LOR saline  Needle:  Needle type: Tuohy  Needle gauge: 17 G Needle length: 9 cm Needle insertion depth: 6 cm Catheter type: closed end flexible Catheter size: 20 Guage Catheter at skin depth: 10 cm Test dose: negative  Assessment Events: blood not aspirated, injection not painful, no injection resistance, negative IV test and no paresthesia  Additional Notes Reason for block:procedure for pain

## 2018-11-26 NOTE — Anesthesia Preprocedure Evaluation (Signed)
Anesthesia Evaluation  Patient identified by MRN, date of birth, ID band Patient awake    Reviewed: Allergy & Precautions, NPO status , Patient's Chart, lab work & pertinent test results  Airway Mallampati: II  TM Distance: >3 FB Neck ROM: Full    Dental no notable dental hx.    Pulmonary neg pulmonary ROS, former smoker,    Pulmonary exam normal breath sounds clear to auscultation       Cardiovascular negative cardio ROS Normal cardiovascular exam Rhythm:Regular Rate:Normal     Neuro/Psych negative neurological ROS  negative psych ROS   GI/Hepatic negative GI ROS, Neg liver ROS,   Endo/Other  negative endocrine ROS  Renal/GU negative Renal ROS  negative genitourinary   Musculoskeletal negative musculoskeletal ROS (+)   Abdominal   Peds negative pediatric ROS (+)  Hematology negative hematology ROS (+)   Anesthesia Other Findings   Reproductive/Obstetrics (+) Pregnancy                             Anesthesia Physical Anesthesia Plan  ASA: II  Anesthesia Plan: Epidural   Post-op Pain Management:    Induction:   PONV Risk Score and Plan:   Airway Management Planned:   Additional Equipment:   Intra-op Plan:   Post-operative Plan:   Informed Consent:   Plan Discussed with:   Anesthesia Plan Comments:         Anesthesia Quick Evaluation  

## 2018-11-27 LAB — CBC
HCT: 32.3 % — ABNORMAL LOW (ref 36.0–46.0)
Hemoglobin: 10.5 g/dL — ABNORMAL LOW (ref 12.0–15.0)
MCH: 30.3 pg (ref 26.0–34.0)
MCHC: 32.5 g/dL (ref 30.0–36.0)
MCV: 93.1 fL (ref 80.0–100.0)
Platelets: 205 10*3/uL (ref 150–400)
RBC: 3.47 MIL/uL — ABNORMAL LOW (ref 3.87–5.11)
RDW: 13.3 % (ref 11.5–15.5)
WBC: 21.5 10*3/uL — ABNORMAL HIGH (ref 4.0–10.5)
nRBC: 0 % (ref 0.0–0.2)

## 2018-11-27 LAB — RPR: RPR Ser Ql: NONREACTIVE — AB

## 2018-11-27 LAB — GLUCOSE, CAPILLARY: Glucose-Capillary: 118 mg/dL — ABNORMAL HIGH (ref 70–99)

## 2018-11-27 NOTE — Progress Notes (Signed)
PPD #1 SVD  S:  Reports feeling tired, but good             Tolerating po/ No nausea or vomiting             Bleeding is moderate, no clots             Pain controlled with acetaminophen and ibuprofen (OTC)             Up ad lib / ambulatory / voiding w/o difficulty  Newborn Breast / Circumcision N/A, girl  O:               VS: BP 95/60 (BP Location: Right Arm)   Pulse 90   Temp 98.6 F (37 C) (Oral)   Resp 18   Ht 5\' 4"  (1.626 m)   Wt 88 kg   SpO2 100%   Breastfeeding Unknown   BMI 33.30 kg/m    LABS:              Recent Labs    11/26/18 0912  WBC 16.9*  HGB 12.1  PLT 241               Blood type: --/--/A POS, A POS Performed at [facility], [place] [address] [place], [place] [address]  [phone])  Rubella: Immune (09/29 0000)                     I&O: Intake/Output      09/29 0701 - 09/30 0700   I.V. (mL/kg) 1958.2 (22.3)   IV Piggyback 106.4   Total Intake(mL/kg) 2064.6 (23.5)   Urine (mL/kg/hr) 1300   Blood 107   Total Output 1407   Net +657.6                     Physical Exam:             Alert and oriented X3  Lungs: Clear and unlabored  Heart: regular rate and rhythm / no mumurs  Abdomen: soft, non-tender, non-distended              Fundus: firm, non-tender, below umbilicus  Perineum: intact, hemorrhoids  Lochia: appropriate  Extremities: no edema, no calf pain or tenderness    A: PPD #1   GDM A2  Doing well - stable status  Breastfeeding P: Routine post partum orders  Fasting BG this AM  Plan D/C 10/1  Burman Foster, MSN, CNM 11/27/2018 5:36 AM

## 2018-11-27 NOTE — Progress Notes (Signed)
MOB was referred for history of depression/anxiety.  * Referral screened out by Clinical Social Worker because none of the following criteria appear to apply:  ~ History of anxiety/depression during this pregnancy, or of post-partum depression following prior delivery. ~ Diagnosis of anxiety and/or depression within last 3 years. Per chart review, MOB's anxiety/depression date back to 2015 and per [facility] records, no recent or current medications or issues.  OR * MOB's symptoms currently being treated with medication and/or therapy.  Please contact the Clinical Social Worker if needs arise, by MOB request, or if MOB scores greater than 9/yes to question 10 on Edinburgh Postpartum Depression Screen.  Eryka Dolinger, LCSWA  [facility] [phone]  

## 2018-11-27 NOTE — Lactation Note (Signed)
This note was copied from a baby's chart. Lactation Consultation Note  Patient Name: Chelsea Jimenez [id number] Date: 11/27/2018 Reason for consult: Initial assessment;1st time breastfeeding;Late-preterm 34-36.6wks;Maternal endocrine disorder Type of Endocrine Disorder?: Diabetes P1, 10 hour female LPTI. Mom with hx of GDM in pregnancy. Infant had 2 stools since birth.  Tools given: DEBP for breast stimulation and induction, infant is LPTI , mom was given a  hand pump due to not having a breastpump at home and she is not enrolled in [facility]. LC encouraged mom to apply for the [facility] program since she receives  Medicaid.  Per mom, infant has latched 3 times and made three attempts prior. Mom feels infant is latching well now and breastfed for 20 minutes prior to [facility] entering the room. LC did not observe latch at this time. LC discussed hand expression and mom taught back infant was given 5 ml of colostrum by spoon. LC observed Mom nipples are everted and no trauma was noted on breast tissue. Per mom, she has used DEBP twice and small amount of colostrum she saw on breast flange she gave to infant. LC discussed LPTI policies: mom will do STS as much as possible, plan feed baby 8 to 12 times within 24 hours with feeding cues and not make baby wait to feed and mom understands not to feed baby longer than 30 minutes per feeding. LC encourage mom to supplement infant with EBM after feedings at breast to give extra volume by hand expression or pumping.  Mom knows to call Nurse or [place] if she has any questions, concerns or needs assistance with latching infant to breast. Mom made aware of O/P services, breastfeeding support groups, community resources, and our phone # for post-discharge questions.   Maternal Data Formula Feeding for Exclusion: No Has patient been taught Hand Expression?: Yes(Infant was given 63ml of colostrum by spoon.) Does the patient have breastfeeding experience prior to this  delivery?: No  Feeding Feeding Type: Breast Fed  LATCH Score                   Interventions Interventions: Breast feeding basics reviewed;Skin to skin;Hand express;Expressed milk;DEBP  Lactation Tools Discussed/Used WIC Program: No Pump Review: Setup, frequency, and cleaning;Milk Storage Initiated by:: Nurse Date initiated:: 11/26/18   Consult Status Consult Status: Follow-up Date: 11/27/18 Follow-up type: In-patient    Vicente Serene 11/27/2018, 3:22 AM

## 2018-11-27 NOTE — Lactation Note (Signed)
This note was copied from a baby's chart. Lactation Consultation Note  Patient Name: Chelsea Jimenez [id number] Date: 11/27/2018 Reason for consult: Follow-up assessment;Late-preterm 34-36.6wks;Infant weight loss;Maternal endocrine disorder Type of Endocrine Disorder?: Diabetes  P1, 40 hour female infant, LPTI, -2% weight loss. Per mom, she is having breast soreness. Mom was using the wrong breast flange she now switched to 27 mm flange and she did not use initiation phase when pumping and using pump on high setting. LC reviewed how to use DEBP on initial setting and lowing gauge level to comfort when pumping. Mom had infant latched on right breast using the football hold, infant latched with depth and swallows observed infant breastfed for 10 minutes and mom switched infant to left breast and used cross cradle hold infant was still breastfeeding 5 minutes with total 15 minutes when LC left the room. 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 Nurse with give mom ice to help with comfort of neck and shoulder while breastfeeding. Mom has coconut oil she is applying to nipples and will put coconut oil in breast flange prior to using DEBP. Per mom, she has pumped 5 times within the past 24 hours and LC encourage any pumped EBM to give to infant. Mom has been hand expressing after latching infant to breast and giving back volume. Mom will continue to follow LPTI guideline policies as discussed by LC earlier.   Mom will do as much  STS as possible, breastfeed infant 8 to 12 times within 24 hours with feeding cues, wake baby to eat if infant is not showing feeding cues, will not breastfed infant longer than 30 minutes. Mom will give infant back any EBM by hand expression and using DEBP. Mom knows  To call Nurse or [place] if she has any questions, concerns or need assistance with latching infant to breast.  Maternal Data    Feeding Feeding Type:  Breast Fed  LATCH Score Latch: Grasps breast easily, tongue down, lips flanged, rhythmical sucking.  Audible Swallowing: Spontaneous and intermittent  Type of Nipple: Everted at rest and after stimulation  Comfort (Breast/Nipple): Filling, red/small blisters or bruises, mild/mod discomfort(bruise on breast mom using wrong breast flange size when pumping.)  Hold (Positioning): No assistance needed to correctly position infant at breast.  LATCH Score: 9  Interventions Interventions: Breast feeding basics reviewed;DEBP  Lactation Tools Discussed/Used     Consult Status Consult Status: Follow-up Date: 11/27/18 Follow-up type: In-patient    Vicente Serene 11/27/2018, 9:49 PM

## 2018-11-27 NOTE — Anesthesia Postprocedure Evaluation (Signed)
Anesthesia Post Note  Patient: HENRIETTA CIESLEWICZ  Procedure(s) Performed: AN AD HOC LABOR EPIDURAL     Patient location during evaluation: Mother Baby Anesthesia Type: Epidural Level of consciousness: awake and alert and oriented Pain management: satisfactory to patient Vital Signs Assessment: post-procedure vital signs reviewed and stable Respiratory status: respiratory function stable Cardiovascular status: stable Postop Assessment: no headache, no backache, epidural receding, patient able to bend at knees, no signs of nausea or vomiting and adequate PO intake Anesthetic complications: no    Last Vitals:  Vitals:   11/26/18 2350 11/27/18 0349  BP: (!) 95/49 95/60  Pulse: (!) 105 90  Resp: 18 18  Temp: 37.2 C 37 C  SpO2:      Last Pain:  Vitals:   11/27/18 0758  TempSrc:   PainSc: 4    Pain Goal:                   Chattie Greeson

## 2018-11-28 ENCOUNTER — Ambulatory Visit: Payer: Self-pay

## 2018-11-28 DIAGNOSIS — O24419 Gestational diabetes mellitus in pregnancy, unspecified control: Secondary | ICD-10-CM

## 2018-11-28 LAB — GLUCOSE, CAPILLARY: Glucose-Capillary: 95 mg/dL (ref 70–99)

## 2018-11-28 MED ORDER — IBUPROFEN 600 MG PO TABS: 600.0000 mg | ORAL_TABLET | Freq: Four times a day (QID) | ORAL | 0 refills | Status: DC

## 2018-11-28 NOTE — Lactation Note (Signed)
This note was copied from a baby's chart. Lactation Consultation Note  Patient Name: Chelsea Jimenez [id number] Date: 11/28/2018 Reason for consult: Follow-up assessment  LC Follow Up Visit:  Chelsea Jimenez has been feeding better since our visit this a.m.  Parents have increased the supplementation amounts as directed.  Since baby has now begun phototherapy I spoke with RN to be sure she is being diligent about pumping with the DEBP.  RN verified and also spoke with parents about the feeding amounts.  Mother is obtaining colostrum and feeding back EBM to baby.  Follow up tomorrow a.m. by [facility].   Maternal Data    Feeding Feeding Type: Formula Nipple Type: Slow - flow  LATCH Score                   Interventions    Lactation Tools Discussed/Used     Consult Status Consult Status: Follow-up Date: 11/29/18 Follow-up type: In-patient    Chelsea Jimenez 11/28/2018, 5:36 PM

## 2018-11-28 NOTE — Lactation Note (Signed)
This note was copied from a baby's chart. Lactation Consultation Note  Patient Name: Chelsea Jimenez [id number] Date: 11/28/2018 Reason for consult: Follow-up assessment;Late-preterm 34-36.6wks;Primapara;1st time breastfeeding;Infant < 6lbs  P1 mother whose infant is now 73 hours old.  This is a LPTI at 36+2 weeks with a 7% weight loss and now weighs <6 lbs.  Baby has been breast feeding only until this morning at 0600 when mother requested formula.  She stated that baby has not latched well and she felt baby needed formula because she wasn't "getting enough."  Mother has not been able to latch baby without help from her nurses.  Mother was not familiar with the LPTI feeding supplementation guidelines.  I reviewed with her the policy and showed her where baby's volumes should be 10-20 mls after every feeding.  Parents have the gold slow flow nipple and father stated it is working well.    Mother will breast feed baby at least every three hours or sooner if baby shows feeding cues.  Father will supplement while mother does her pumping and she will feed back any EBM she obtains to baby.  Father will supplement with 10-20 mls after every feeding and allow baby to take more if desired.   Mother does not have a DEBP for home use.  She just received private insurance last week and I suggested she call to see about eligibility to receive a DEBP.  She also would like to see if she qualifies for [facility].  I suggested she call as early as possible today to see about eligibility.  [facility] referral faxed.    Reviewed basic concepts related to the [place].  Mother will call for latch assistance.  She also informed me that her flange size was too small originally, however, she now has the correct flange size.  I suggested she call for my assistance for any problems with latching.  Parents would like to be discharged today, however, I informed them that this probably would not happen due to the feeding issue with their  LPTI.  They will speak to their pediatrician.  Parents with no further questions.   Maternal Data    Feeding Feeding Type: Breast Fed  LATCH Score                   Interventions    Lactation Tools Discussed/Used     Consult Status Consult Status: Follow-up Date: 11/29/18 Follow-up type: In-patient    [name] R Astor Gentle 11/28/2018, 8:52 AM

## 2018-11-28 NOTE — Discharge Summary (Signed)
OB Discharge Summary     Patient Name: Chelsea Jimenez DOB: 07-15-1990 MRN: [id number]  Date of admission: 11/26/2018 Delivering MD: Burman Foster B   Date of discharge: 11/28/2018  Admitting diagnosis: [place] Intrauterine pregnancy: [redacted]w[redacted]d     Secondary diagnosis:  Active Problems:   SVD (9/29)   GDM, class A2  Additional problems: Periurethral laceration, repaired     Discharge diagnosis: Preterm Pregnancy Delivered                                                                                                Post partum procedures: Postpartum antibiotics x1 dose  Augmentation: Pitocin  Complications: Maternal fever  Hospital course:  Onset of Labor With Vaginal Delivery     28 y.o. yo [id number] at [redacted]w[redacted]d was admitted in Active Labor on 11/26/2018. Patient had an uncomplicated labor course as follows:  Membrane Rupture Time/Date: 9:30 AM ,11/26/2018   Intrapartum Procedures: Episiotomy: None [1]                                         Lacerations:  Periurethral [8]  Patient had a delivery of a Viable infant. 11/26/2018  Information for the patient's newborn:  Luanna, Weesner Girl Ciarra [[id number]]  Delivery Method: Vag-Spont     Pateint had an uncomplicated postpartum course.  She is ambulating, tolerating a regular diet, passing flatus, and urinating well. Patient is discharged home in stable condition on 11/28/18.   Physical exam  Vitals:   11/27/18 0800 11/27/18 1414 11/27/18 2238 11/28/18 0500  BP: 102/64 106/68 104/64 111/73  Pulse: 83 78 91 76  Resp: 18 18 18 16   Temp: 98.2 F (36.8 C) 97.8 F (36.6 C) 98.3 F (36.8 C) 98 F (36.7 C)  TempSrc: Oral Oral Oral Oral  SpO2:  100% 98% 99%  Weight:      Height:       General: alert, cooperative and no distress Lochia: appropriate Uterine Fundus: firm, non-tender Perineum: well-approximated, healing DVT Evaluation: No evidence of DVT seen on physical exam. No cords or calf tenderness. No significant  calf/ankle edema. Labs: Lab Results  Component Value Date   WBC 21.5 (H) 11/27/2018   HGB 10.5 (L) 11/27/2018   HCT 32.3 (L) 11/27/2018   MCV 93.1 11/27/2018   PLT 205 11/27/2018   CMP Latest Ref Rng & Units 09/17/2013  Glucose 70 - 99 mg/dL 116(H)  BUN 6 - 23 mg/dL 6  Creatinine 0.50 - 1.10 mg/dL 0.67  Sodium 135 - 145 mEq/L 135  Potassium 3.5 - 5.3 mEq/L 4.2  Chloride 96 - 112 mEq/L 104  CO2 19 - 32 mEq/L 26  Calcium 8.4 - 10.5 mg/dL 9.3  Total Protein 6.0 - 8.3 g/dL 6.7  Total Bilirubin 0.2 - 1.2 mg/dL 0.3  Alkaline Phos 39 - 117 U/L 49  AST 0 - 37 U/L 19  ALT 0 - 35 U/L 25    Discharge instruction: per After Visit Summary and "Baby and Me Booklet".  After visit  meds:  Allergies as of 11/28/2018      Reactions   Morphine And Related Shortness Of Breath, Nausea And Vomiting, Rash   Shellfish Allergy Other (See Comments)   Skin rash   Sulfa Antibiotics Rash, Other (See Comments)   child      Medication List    STOP taking these medications   acetaminophen 500 MG tablet Commonly known as: TYLENOL   calcium carbonate 500 MG chewable tablet Commonly known as: TUMS - dosed in mg elemental calcium   ondansetron 4 MG tablet Commonly known as: ZOFRAN   prenatal vitamin w/FE, FA 27-1 MG Tabs tablet     TAKE these medications   ibuprofen 600 MG tablet Commonly known as: ADVIL Take 1 tablet (600 mg total) by mouth every 6 (six) hours. What changed:   medication strength  how much to take  when to take this  reasons to take this       Diet: routine diet  Activity: Advance as tolerated. Pelvic rest for 6 weeks.   Outpatient follow up:6 weeks Follow up Appt:No future appointments. Follow up Visit:No follow-ups on file.  Postpartum contraception: Undecided  Newborn Data: Live born female  Birth Weight: 6 lb 4.5 oz (2850 g) APGAR: 8, 9  Newborn Delivery   Birth date/time: 11/26/2018 16:43:00 Delivery type: Vaginal, Spontaneous      Baby  Feeding: Breast Disposition:rooming in    Rhea Pink, MSN, CNM 11/28/2018 8:20 AM

## 2019-11-21 ENCOUNTER — Ambulatory Visit (HOSPITAL_COMMUNITY)
Admission: EM | Admit: 2019-11-21 | Discharge: 2019-11-21 | Disposition: A | Discharge status: Other Acute Inpatient Hospital | Payer: 59

## 2019-11-21 ENCOUNTER — Other Ambulatory Visit: Payer: Self-pay

## 2019-11-21 NOTE — Care Management (Signed)
Patient wants to receive services with a psychiatrist and a therapist on an outpatient basis.  Patient was linked to [facility] outpatient walk in clinic.    Patient denies SI/HI/Psychosis/Substance Abuse.  Patient denies MSE Exam.   

## 2019-11-28 ENCOUNTER — Ambulatory Visit (INDEPENDENT_AMBULATORY_CARE_PROVIDER_SITE_OTHER): Payer: 59 | Admitting: Psychology

## 2019-11-28 DIAGNOSIS — F331 Major depressive disorder, recurrent, moderate: Secondary | ICD-10-CM | POA: Diagnosis not present

## 2019-12-04 ENCOUNTER — Ambulatory Visit (INDEPENDENT_AMBULATORY_CARE_PROVIDER_SITE_OTHER): Payer: 59 | Admitting: Psychology

## 2019-12-04 DIAGNOSIS — F331 Major depressive disorder, recurrent, moderate: Secondary | ICD-10-CM

## 2019-12-11 ENCOUNTER — Ambulatory Visit: Payer: 59 | Admitting: Psychology

## 2019-12-18 ENCOUNTER — Ambulatory Visit: Payer: 59 | Admitting: Psychology

## 2019-12-24 ENCOUNTER — Ambulatory Visit (INDEPENDENT_AMBULATORY_CARE_PROVIDER_SITE_OTHER): Payer: 59 | Admitting: Psychology

## 2019-12-24 DIAGNOSIS — F331 Major depressive disorder, recurrent, moderate: Secondary | ICD-10-CM

## 2019-12-31 ENCOUNTER — Ambulatory Visit: Payer: 59 | Admitting: Psychology

## 2020-01-01 NOTE — Progress Notes (Signed)
 Subjective:    Chelsea Jimenez is a 29 y.o. female who presents for evaluation of sore throat. Associated symptoms include hoarseness, nasal blockage, post nasal drip, sinus and nasal congestion and sore throat. Onset of symptoms was 3 days ago, and have been gradually worsening since that time. She is drinking plenty of fluids. She has not had a recent close exposure to someone with proven streptococcal pharyngitis.  The following portions of the patient's history were reviewed and updated as appropriate: allergies, current medications, past family history, past medical history, past social history, past surgical history and problem list.  Review of Systems A complete ROS was performed with pertinent positives/negatives noted in the HPI. The remainder of the ROS are negative.   Objective:   Ht 1.626 m (5' 4)   Wt 87.1 kg (192 lb)   LMP 12/25/2019 (Within Days)   Breastfeeding No   BMI 32.96 kg/m  General appearance: alert, appears stated age, cooperative and no distress Head: Normocephalic, without obvious abnormality, atraumatic Nose: Nares normal. Septum midline. Mucosa normal. No drainage or sinus tenderness. Throat: abnormal findings: mild oropharyngeal erythema Lungs: clear to auscultation bilaterally Pulses: 2+ and symmetric  Laboratory Strep test done. Results:negative.   Assessment:   Acute pharyngitis Viral pharyngitis.   Recent Results (from the past 24 hour(s))  POCT Rapid Strep A   Collection Time: 01/01/20 10:13 AM  Result Value Ref Range   Rapid Strep A Screen Negative Reference Range:  Negative   QC VERIFIED AND ACCEPTABLE Yes    LOT NUMBER [id number]    Exp. Date 03/29/2020     Plan:   Patient placed on antibiotics. Use of OTC analgesics recommended as well as salt water gargles. Use of decongestant recommended. Follow up as needed.   Electronically signed by: Junella Claudene Mclean, FNP 01/01/2020 10:25 AM  Subjective Patient ID: Chelsea Jimenez is a 29 y.o. female.    Electronically signed by: Junella Claudene Mclean, FNP 01/02/20 [phone]

## 2020-01-07 ENCOUNTER — Ambulatory Visit (INDEPENDENT_AMBULATORY_CARE_PROVIDER_SITE_OTHER): Payer: 59 | Admitting: Psychology

## 2020-01-07 DIAGNOSIS — F331 Major depressive disorder, recurrent, moderate: Secondary | ICD-10-CM | POA: Diagnosis not present

## 2020-01-14 ENCOUNTER — Ambulatory Visit (INDEPENDENT_AMBULATORY_CARE_PROVIDER_SITE_OTHER): Payer: 59 | Admitting: Psychology

## 2020-01-14 DIAGNOSIS — F331 Major depressive disorder, recurrent, moderate: Secondary | ICD-10-CM | POA: Diagnosis not present

## 2020-01-21 ENCOUNTER — Ambulatory Visit (INDEPENDENT_AMBULATORY_CARE_PROVIDER_SITE_OTHER): Payer: 59 | Admitting: Psychology

## 2020-01-21 DIAGNOSIS — F331 Major depressive disorder, recurrent, moderate: Secondary | ICD-10-CM | POA: Diagnosis not present

## 2020-01-28 ENCOUNTER — Ambulatory Visit (INDEPENDENT_AMBULATORY_CARE_PROVIDER_SITE_OTHER): Payer: 59 | Admitting: Psychology

## 2020-01-28 DIAGNOSIS — F331 Major depressive disorder, recurrent, moderate: Secondary | ICD-10-CM | POA: Diagnosis not present

## 2020-02-04 ENCOUNTER — Ambulatory Visit (INDEPENDENT_AMBULATORY_CARE_PROVIDER_SITE_OTHER): Payer: Medicaid Other | Admitting: Psychology

## 2020-02-04 DIAGNOSIS — F331 Major depressive disorder, recurrent, moderate: Secondary | ICD-10-CM | POA: Diagnosis not present

## 2020-02-09 ENCOUNTER — Ambulatory Visit (INDEPENDENT_AMBULATORY_CARE_PROVIDER_SITE_OTHER): Payer: Medicaid Other | Admitting: Psychology

## 2020-02-09 DIAGNOSIS — F331 Major depressive disorder, recurrent, moderate: Secondary | ICD-10-CM | POA: Diagnosis not present

## 2020-02-11 ENCOUNTER — Ambulatory Visit: Payer: 59 | Admitting: Psychology

## 2020-02-16 ENCOUNTER — Ambulatory Visit: Payer: Medicaid Other | Admitting: Psychology

## 2020-02-17 ENCOUNTER — Ambulatory Visit (INDEPENDENT_AMBULATORY_CARE_PROVIDER_SITE_OTHER): Payer: Medicaid Other | Admitting: Psychology

## 2020-02-17 DIAGNOSIS — F331 Major depressive disorder, recurrent, moderate: Secondary | ICD-10-CM | POA: Diagnosis not present

## 2020-02-18 ENCOUNTER — Ambulatory Visit: Payer: 59 | Admitting: Psychology

## 2020-02-25 ENCOUNTER — Ambulatory Visit (INDEPENDENT_AMBULATORY_CARE_PROVIDER_SITE_OTHER): Payer: Medicaid Other | Admitting: Psychology

## 2020-02-25 DIAGNOSIS — F331 Major depressive disorder, recurrent, moderate: Secondary | ICD-10-CM | POA: Diagnosis not present

## 2020-03-03 ENCOUNTER — Ambulatory Visit: Payer: 59 | Admitting: Psychology

## 2020-03-05 ENCOUNTER — Ambulatory Visit: Payer: Medicaid Other | Admitting: Psychology

## 2020-03-10 ENCOUNTER — Ambulatory Visit: Payer: 59 | Admitting: Psychology

## 2020-03-13 ENCOUNTER — Other Ambulatory Visit: Payer: Self-pay

## 2020-03-13 ENCOUNTER — Other Ambulatory Visit: Payer: Medicaid Other

## 2020-03-13 DIAGNOSIS — Z20822 Contact with and (suspected) exposure to covid-19: Secondary | ICD-10-CM

## 2020-03-16 LAB — NOVEL CORONAVIRUS, NAA: SARS-CoV-2, NAA: DETECTED — AB

## 2020-03-17 ENCOUNTER — Ambulatory Visit: Payer: 59 | Admitting: Psychology

## 2020-03-24 ENCOUNTER — Ambulatory Visit: Payer: 59 | Admitting: Psychology

## 2020-03-31 ENCOUNTER — Ambulatory Visit: Payer: 59 | Admitting: Psychology

## 2020-04-07 ENCOUNTER — Ambulatory Visit: Payer: 59 | Admitting: Psychology

## 2020-04-14 ENCOUNTER — Ambulatory Visit: Payer: 59 | Admitting: Psychology

## 2020-04-21 ENCOUNTER — Ambulatory Visit: Payer: 59 | Admitting: Psychology

## 2021-07-06 DIAGNOSIS — H10021 Other mucopurulent conjunctivitis, right eye: Secondary | ICD-10-CM | POA: Diagnosis not present

## 2022-03-02 NOTE — Progress Notes (Signed)
 " Subjective:  Patient ID: Chelsea Jimenez is a 32 y.o. female.  Chief Complaint  Patient presents with   Respiratory     HPI  Respiratory Primary Symptom: Cough/Respiratory Complaint   Duration of current symtoms:  4 days Onset quality:   slowly over time  Associated symptoms: nasal congestion, rhinorrhea, diaphoresis, sinus pain and sore throat   Associated symptoms: no myalgias, no ear pain, no fever, no headaches, no chest pain, no chest tightness, no nocturnal dyspnea and no shortness of breath   Treatments tried comment:  Alka selzter, dayquil, nyquil, theraflu, tylenol  sinus, zyrtec Cough Characteristics: nonproductive   Nonproductive Cough: dry          Review of Systems  Constitutional: Positive for diaphoresis. Negative for fever.  HENT: Positive for congestion, rhinorrhea, sinus pain, sore throat and voice change. Negative for ear pain.   Respiratory: Negative.  Negative for chest tightness and shortness of breath.   Cardiovascular: Negative.  Negative for chest pain.  Gastrointestinal: Negative.   Musculoskeletal: Negative.  Negative for myalgias.  Skin: Negative.   Neurological: Negative.  Negative for headaches.  All other systems reviewed and are negative.   Social History   Tobacco Use  Smoking Status Never   Passive exposure: Never  Smokeless Tobacco Never   Past Medical History:  Diagnosis Date   Anxiety    Depression    History reviewed. No pertinent surgical history. History reviewed. No pertinent family history. Objective:      Physical Exam Constitutional:      Appearance: Normal appearance.  HENT:     Head: Normocephalic.     Right Ear: Tympanic membrane, ear canal and external ear normal.     Left Ear: Tympanic membrane, ear canal and external ear normal.     Nose: No congestion or rhinorrhea.     Right Sinus: Maxillary sinus tenderness present.     Left Sinus: Maxillary sinus tenderness present.     Mouth/Throat:     Mouth:  Mucous membranes are moist.     Pharynx: Oropharynx is clear. No oropharyngeal exudate or posterior oropharyngeal erythema.  Eyes:     Conjunctiva/sclera: Conjunctivae normal.  Cardiovascular:     Rate and Rhythm: Normal rate and regular rhythm.     Pulses: Normal pulses.     Heart sounds: Normal heart sounds.  Pulmonary:     Effort: Pulmonary effort is normal.     Breath sounds: Normal breath sounds. No wheezing or rhonchi.  Chest:     Chest wall: No tenderness.  Abdominal:     Tenderness: There is no abdominal tenderness.  Lymphadenopathy:     Cervical: No cervical adenopathy.  Skin:    General: Skin is warm and dry.  Neurological:     Mental Status: She is alert and oriented to person, place, and time.  Psychiatric:        Mood and Affect: Mood normal.        Behavior: Behavior normal.       Assessment/Plan:  HPI provided by Self  Based on today's visit:history and physical exam only, as no relevant testing deemed necessary patient's visit diagnosis is/includes  1. Acute nasopharyngitis (common cold)    Patient does not have a history of chronic conditions.  Treatment plan includes:  Orders Placed: No orders of the defined types were placed in this encounter.  Medications ordered this visit    No prescriptions requested or ordered in this encounter    Current medication list and  any new medications prescribed or recommended today were reviewed with the patient and specific instructions were provided Yes  Provider Recommendations   Expect symptoms to last anywhere between 7-10 days Prompt follow up needed for chest pain, shortness of breath, persistent fever, nausea/vomiting/diarrhea or worsening of symptoms Tylenol / Ibuprofen  for myalgias/headaches, sore throat and/or fever  Delsym or Robitussin for cough  Mucinex and/ or Flonase for nasal congestion/ multi-symptom relief  Daily multivitamin with Vitamin C/D and Zinc to help strengthen immune system  If no  symptom improvement within 7-10 days, follow up with [facility] or PCP     Follow up care instructions were provided and reviewed?with the  Patient. All questions were answered. Patient verbalized understanding of plan of care today.                                         "

## 2022-04-24 NOTE — Progress Notes (Signed)
 " Subjective:  Patient ID: Chelsea Jimenez is a 31 y.o. female.  Chief Complaint  Patient presents with   Contraception     HPI  Patient wants to resume birth control, has been without BC for 3 years.   Contraception Patient presents for:  Initiation Virtual visit patient reported systolic blood pressure (recent reading):  < 120 (Normal) Virtual visit patient reported diastolic blood pressure (recent reading):  80-89 (Elevated) Pregnancy status:  Not pregnant Date of start of last menstrual period:  04/04/2022 (patient has not been sexually active\) Pattern of menses:  Regular 1 of the following criteria must be met to be reasonable certain patient is not pregnant. If not, pregnancy testing should be performed prior to initiating contraception. Criteria::  Has not had sexual intercourse since the start of last normal menses Pregnancy plans:  Short-term contraception (less than 3 years) PMH significant for: diabetes (hx of gestional diabetes)   PMH significant for: no breast cancer, not current smoker, no hypertension, no migraine with aura, no lupus, no cirrhosis, no gastric bypass, no ascending cholangitis, no vascular disease, no heart disease, no past or current DVT or pulmonary embolism (PE), no Hx stroke / cerebrovascular accident, no abnormal uterine bleeding and not < 42 days postpartum   Do you have Multiple Risk Factors of atherosclerotic cardiovascular disease (e.g., greater than 34 yrs , smoker, diabetes, hypertension, low HDL, high LDL, or high triglyceride levels)?: No   Do you take medicine for seizures, tuberculosis, or prevention or treatment of HIV?: No          Review of Systems  All other systems reviewed and are negative.   Social History   Tobacco Use  Smoking Status Never   Passive exposure: Never  Smokeless Tobacco Never   Past Medical History:  Diagnosis Date   Anxiety    Depression    History reviewed. No pertinent surgical history. History  reviewed. No pertinent family history. Objective:      Physical Exam Constitutional:      Appearance: Normal appearance.  Cardiovascular:     Rate and Rhythm: Normal rate and regular rhythm.     Heart sounds: Normal heart sounds.  Pulmonary:     Effort: Pulmonary effort is normal.     Breath sounds: Normal breath sounds.  Skin:    General: Skin is warm and dry.  Neurological:     Mental Status: She is alert and oriented to person, place, and time.  Psychiatric:        Mood and Affect: Mood normal.        Behavior: Behavior normal.       Assessment/Plan:  HPI provided by Self  Based on today's visit:history, physical exam and all relevant testing completed in clinic today patient's visit diagnosis is/includes  1. Encounter for initial prescription of contraceptive pills    Patient does not have a history of chronic conditions.  Treatment plan includes:  Orders Placed: No orders of the defined types were placed in this encounter.  Medications ordered this visit   Signed Prescriptions Disp Refills   levonorgestrel-ethinyl estradiol (AVIANE) 0.1-20 mg-mcg tablet 28 tablet 12    Sig: Take 1 tablet by mouth daily Take as directed.    Current medication list and any new medications prescribed or recommended today were reviewed with the patient and specific instructions were provided Yes  Provider Recommendations     Follow up care instructions were provided and reviewed?with the  Patient. All questions were answered. Patient verbalized  understanding of plan of care today.                                         "

## 2023-01-03 NOTE — Progress Notes (Signed)
 "  [facility] Visit Note    Chelsea Jimenez is a 32 y.o. female who presents with URI.   History obtained from patient.   Assessment & Plan:    Assessment & Plan Acute non-recurrent maxillary sinusitis  Orders:   amoxicillin-clavulanate (AUGMENTIN) 875-125 mg tablet; Take 1 tablet by mouth 2 (two) times a day for 5 days   Today you were prescribed an antibiotic to treat a bacterial infection. Take medication as prescribed with food and complete antibiotic therapy despite potential early resolution of symptoms. Maintain adequate hydration. Antibiotic therapy lessens the effects of birth control, therefore backup contraception reviewed. Signs that you need to follow up sooner include chest pain, shortness of breath, nausea/vomiting, syncope, headaches, fever/chills, abdominal pain, and/or rash. If allergic reaction occurs with medication, stop taking the medication and consult with clinic/ or report to ER based on severity. Follow up within 7 days if no symptom improvement.         Nausea without vomiting  Orders:   ondansetron  (ZOFRAN ) 4 MG tablet; Take 2 tablets (8 mg total) by mouth 2 (two) times a day as needed for nausea for up to 4 doses      No follow-ups on file.  Subjective     Respiratory Duration of current symptoms:  1 week Onset quality:  Slowly over time Associated symptoms: body aches, chills, cough, nasal congestion, diarrhea, nausea, postnasal drip, runny nose (green/sticky), sinus pain, sore throat and swollen glands   Associated symptoms: no chest pain, no chest tightness, no fever, no headaches, no nocturnal dyspnea, no shortness of breath and no vomiting   Treatments tried:  Rest, humidifier or steam, pain relievers and over-the-counter medication (Theraflu, Tylenol  sinus severe, allergy medication, benadryl , cough drops, numb throat spray) Response to treatment:  Worsened symptoms Special considerations:  None apply    Allergies  Allergen  Reactions   Morphine Nausea And Vomiting, Rash and Shortness Of Breath   Sulfa (Sulfonamide Antibiotics) Other (See Comments) and Rash    child    Review of Systems  Constitutional:  Positive for chills. Negative for fever.  HENT:  Positive for congestion, postnasal drip, rhinorrhea (green/sticky), sinus pain, sore throat and voice change.   Respiratory:  Positive for cough. Negative for chest tightness and shortness of breath.   Cardiovascular: Negative.  Negative for chest pain.  Gastrointestinal:  Positive for diarrhea and nausea. Negative for vomiting.  Musculoskeletal:  Positive for myalgias.  Skin: Negative.   Neurological: Negative.  Negative for headaches.  All other systems reviewed and are negative.    Objective     Vital Signs: BP 114/75 (Site: Left arm, Position: Sitting, Cuff Size: Adult)   Pulse (!) 108   Temp 98.2 F (36.8 C) (Temporal)   Resp 18   Ht 5' 4 (1.626 m)   Wt 207 lb (93.9 kg)   SpO2 97%   BMI 35.53 kg/m    Physical Exam Constitutional:      Appearance: Normal appearance.  HENT:     Head: Normocephalic.     Right Ear: Tympanic membrane, ear canal and external ear normal.     Left Ear: Tympanic membrane, ear canal and external ear normal.     Nose: Congestion present. No rhinorrhea.     Right Sinus: Maxillary sinus tenderness present.     Left Sinus: Maxillary sinus tenderness present.     Mouth/Throat:     Mouth: Mucous membranes are moist.     Pharynx: Oropharynx is clear. Posterior oropharyngeal  erythema present. No oropharyngeal exudate.  Eyes:     Conjunctiva/sclera: Conjunctivae normal.  Cardiovascular:     Rate and Rhythm: Normal rate and regular rhythm.     Pulses: Normal pulses.     Heart sounds: Normal heart sounds.  Pulmonary:     Effort: Pulmonary effort is normal.     Breath sounds: Normal breath sounds. No wheezing or rhonchi.  Chest:     Chest wall: No tenderness.  Abdominal:     Tenderness: There is no abdominal  tenderness.  Lymphadenopathy:     Cervical: No cervical adenopathy.  Skin:    General: Skin is warm and dry.  Neurological:     Mental Status: She is alert and oriented to person, place, and time.  Psychiatric:        Mood and Affect: Mood normal.        Behavior: Behavior normal.                    "

## 2024-03-06 ENCOUNTER — Encounter: Payer: Self-pay | Admitting: Intensive Care

## 2024-03-06 ENCOUNTER — Emergency Department
Admission: EM | Admit: 2024-03-06 | Discharge: 2024-03-06 | Disposition: A | Discharge status: Home / Self Care | Attending: Emergency Medicine | Admitting: Emergency Medicine

## 2024-03-06 ENCOUNTER — Other Ambulatory Visit: Payer: Self-pay

## 2024-03-06 ENCOUNTER — Emergency Department

## 2024-03-06 DIAGNOSIS — R55 Syncope and collapse: Secondary | ICD-10-CM | POA: Diagnosis present

## 2024-03-06 DIAGNOSIS — D72829 Elevated white blood cell count, unspecified: Secondary | ICD-10-CM | POA: Insufficient documentation

## 2024-03-06 HISTORY — DX: Attention-deficit hyperactivity disorder, unspecified type: F90.9

## 2024-03-06 LAB — CBC
HCT: 40.5 % (ref 36.0–46.0)
Hemoglobin: 13.4 g/dL (ref 12.0–15.0)
MCH: 29.8 pg (ref 26.0–34.0)
MCHC: 33.1 g/dL (ref 30.0–36.0)
MCV: 90.2 fL (ref 80.0–100.0)
Platelets: 262 K/uL (ref 150–400)
RBC: 4.49 MIL/uL (ref 3.87–5.11)
RDW: 12.1 % (ref 11.5–15.5)
WBC: 11.5 K/uL — ABNORMAL HIGH (ref 4.0–10.5)
nRBC: 0 % (ref 0.0–0.2)

## 2024-03-06 LAB — COMPREHENSIVE METABOLIC PANEL WITH GFR
ALT: 22 U/L (ref 0–44)
AST: 20 U/L (ref 15–41)
Albumin: 4 g/dL (ref 3.5–5.0)
Alkaline Phosphatase: 54 U/L (ref 38–126)
Anion gap: 11 (ref 5–15)
BUN: 11 mg/dL (ref 6–20)
CO2: 22 mmol/L (ref 22–32)
Calcium: 9.2 mg/dL (ref 8.9–10.3)
Chloride: 106 mmol/L (ref 98–111)
Creatinine, Ser: 0.74 mg/dL (ref 0.44–1.00)
GFR, Estimated: >60 mL/min
Glucose, Bld: 104 mg/dL — ABNORMAL HIGH (ref 70–99)
Potassium: 3.9 mmol/L (ref 3.5–5.1)
Sodium: 138 mmol/L (ref 135–145)
Total Bilirubin: 0.3 mg/dL (ref 0.0–1.2)
Total Protein: 7.1 g/dL (ref 6.5–8.1)

## 2024-03-06 MED ORDER — CYCLOBENZAPRINE HCL 10 MG PO TABS: 10.0000 mg | ORAL_TABLET | Freq: Three times a day (TID) | ORAL | 0 refills | Status: AC | PRN

## 2024-03-06 MED ORDER — PREDNISONE 50 MG PO TABS: 50.0000 mg | ORAL_TABLET | Freq: Every day | ORAL | 0 refills | Status: AC

## 2024-03-06 MED ORDER — IBUPROFEN 600 MG PO TABS: 600.0000 mg | ORAL_TABLET | Freq: Four times a day (QID) | ORAL | 0 refills | Status: AC | PRN

## 2024-03-06 NOTE — ED Notes (Signed)
 First Nurse Note: Pt to ED via [facility] from [facility]. Pt fell this morning and was at ortho to get checked out. Pt states that she turned to look at the doctor and had pain in her neck and shoulder and then had a syncopal episode. Per EMS pt out less than 1 minute.  BP: 112/78 HR: 90 SpO2: 98%

## 2024-03-06 NOTE — Discharge Instructions (Signed)
 Follow-up with your primary care provider.  Return to the ER immediately for new, worsening, recurrent dizziness or lightheadedness, recurrent episodes of passing out, new or worsening headache, vomiting, vision changes, weakness or numbness in your arms or legs, or any other new or worsening symptoms that concern you.  For your pain, we have prescribed prednisone  which is a steroid that decreases inflammation and is often helpful with nerve and other neck and back pain.  This is to be taken daily.  We have also prescribed ibuprofen  and Flexeril , which is a muscle laxer, to be taken as needed.

## 2024-03-06 NOTE — ED Triage Notes (Signed)
 Arrived by [facility] From [facility]. Patient reports slipping down hill this AM. C/o pain in mid-upper back, neck and shoulder. Reports hitting head. Denies LOC after fall this AM.  While at [facility], patient reports she passed out on exam table. Staff told EMS patient had LOC X1 minute.   A&O x4 upon arrival

## 2024-03-06 NOTE — ED Provider Notes (Signed)
 "  [facility] Provider Note    Event Date/Time   First MD Initiated Contact with Patient 03/06/24 1940     (approximate)   History   Loss of Consciousness   HPI  Chelsea Jimenez is a 34 y.o. female with no significant PMH who presents with syncope.  The patient states that this morning she had a mechanical fall while going down a hill after dropping her child off at school, falling backwards.  She separately started to have neck pain with some tingling in her right arm as well as bilateral shoulder pain.  She went to [facility] for evaluation and had x-rays done.  She was told that the x-rays were fine and was being examined when she started to feel lightheaded and then passed out.  Subsequently she came to.  Then while she was being transported by EMS when they placed an IV she came very close to passing out a second time.  She states that she has had similar episodes with blood draws in the past.  At this time, the patient denies feeling significantly dizzy or lightheaded.  She has no chest pain or difficulty breathing.  She denies fevers.  She has no vomiting or diarrhea.  She reports some continued bilateral neck and upper back pain.  She has no numbness or weakness.  I reviewed the past medical records per the patient's most recent outpatient visit in our system was in November 2024 with family medicine for URI.  She has no recent ED visits or hospitalizations.   Physical Exam   Triage Vital Signs: ED Triage Vitals  Encounter Vitals Group     BP 03/06/24 1833 115/80     Girls Systolic BP Percentile --      Girls Diastolic BP Percentile --      Boys Systolic BP Percentile --      Boys Diastolic BP Percentile --      Pulse Rate 03/06/24 1833 91     Resp 03/06/24 1833 15     Temp 03/06/24 1833 98.4 F (36.9 C)     Temp Source 03/06/24 1833 Oral     SpO2 03/06/24 1833 97 %     Weight 03/06/24 1829 207 lb (93.9 kg)     Height 03/06/24 1829 5'  4 (1.626 m)     Head Circumference --      Peak Flow --      Pain Score 03/06/24 1829 8     Pain Loc --      Pain Education --      Exclude from Growth Chart --     Most recent vital signs: Vitals:   03/06/24 2056 03/06/24 2057  BP: 118/77 114/75  Pulse: 85 91  Resp:    Temp:    SpO2:       General: Alert, comfortable appearing, no distress.  CV:  Good peripheral perfusion.  Resp:  Normal effort.  Abd:  No distention.  Other:  EOMI.  PERRLA.  No photophobia.  No facial droop.  Normal speech.  Motor and sensory intact in all extremities.  No midline spinal tenderness.   ED Results / Procedures / Treatments   Labs (all labs ordered are listed, but only abnormal results are displayed) Labs Reviewed  COMPREHENSIVE METABOLIC PANEL WITH GFR - Abnormal; Notable for the following components:      Result Value   Glucose, Bld 104 (*)    All other components within normal limits  CBC - Abnormal; Notable for the following components:   WBC 11.5 (*)    All other components within normal limits     EKG  ED ECG REPORT I, Waylon Cassis, the attending physician, personally viewed and interpreted this ECG.  Date: 03/06/2024 EKG Time: 1835 Rate: 88 Rhythm: normal sinus rhythm QRS Axis: normal Intervals: normal ST/T Wave abnormalities: normal Narrative Interpretation: no evidence of acute ischemia    RADIOLOGY  CT head: I independently viewed and interpreted the images; there is no ICH.  Radiology report indicates no acute abnormality.   PROCEDURES:  Critical Care performed: No  Procedures   MEDICATIONS ORDERED IN ED: Medications - No data to display   IMPRESSION / MDM / ASSESSMENT AND PLAN / ED COURSE  I reviewed the triage vital signs and the nursing notes.  34 year old female with PMH as noted above presents with a syncopal episode while she is being examined at [facility] and was having some acute pain.  She had a recurrent near syncopal  episode while with EMS.  Her symptoms have now resolved.  On exam she is overall comfortable appearing.  Vital signs are normal.  Neurologic exam is nonfocal.  Differential diagnosis includes, but is not limited to, vasovagal syncope, dehydration, electrolyte abnormality, anemia there is no evidence of cardiac etiology.  Patient's presentation is most consistent with acute complicated illness / injury requiring diagnostic workup.  CT head obtained from triage was negative.  EKG is normal.  There is no indication for cardiac enzymes.  CMP and CBC are unremarkable.  The patient already had x-rays done at [facility].  Although I do not have access to written reports, the patient was told that they were normal.  At this time there is no indication for further lab workup.  We will obtain orthostatics and reassess.  ----------------------------------------- 9:05 PM on 03/06/2024 -----------------------------------------  Orthostatic vital signs remain normal.  The patient feels well and is stable for discharge at this time.  I counseled her on the results of workup and answered all of her questions.  I gave strict return precautions and she expressed understanding.  I have prescribed ibuprofen , Flexeril , and prednisone  for likely cervical radiculopathy.     FINAL CLINICAL IMPRESSION(S) / ED DIAGNOSES   Final diagnoses:  Syncope, unspecified syncope type     Rx / DC Orders   ED Discharge Orders          Ordered    cyclobenzaprine  (FLEXERIL ) 10 MG tablet  3 times daily PRN        03/06/24 2104    ibuprofen  (ADVIL ) 600 MG tablet  Every 6 hours PRN        03/06/24 2104    predniSONE  (DELTASONE ) 50 MG tablet  Daily with breakfast        03/06/24 2104             Note:  This document was prepared using Dragon voice recognition software and may include unintentional dictation errors.    Cassis Waylon, MD 03/06/24 2134  "

## 2024-03-12 ENCOUNTER — Encounter: Admitting: Family Medicine

## 2024-03-12 NOTE — Progress Notes (Signed)
 Joanann  Given the nature of what happen, we feel it is best to have you see in person for assessment and possible scans to see if anything was missed the first time around.  There will be no charge for this EV.  I do recommend going back to [facility] given the orthopedic nature of it all.     NOTE: There will be NO CHARGE for this E-Visit
